# Patient Record
Sex: Male | Born: 1965
Health system: Southern US, Community
[De-identification: ages and names within clinical notes are randomized; demographics above are authoritative.]

## PROBLEM LIST (undated history)

## (undated) DIAGNOSIS — Z789 Other specified health status: Secondary | ICD-10-CM

## (undated) DIAGNOSIS — J309 Allergic rhinitis, unspecified: Secondary | ICD-10-CM

## (undated) DIAGNOSIS — E785 Hyperlipidemia, unspecified: Secondary | ICD-10-CM

## (undated) DIAGNOSIS — I10 Essential (primary) hypertension: Secondary | ICD-10-CM

---
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
## 1995-08-13 HISTORY — PX: OTHER SURGICAL HISTORY: SHX169

## 2003-02-24 ENCOUNTER — Observation Stay (HOSPITAL_COMMUNITY): Admission: EM | Admit: 2003-02-24 | Discharge: 2003-02-25 | Payer: Self-pay | Admitting: Emergency Medicine

## 2003-02-24 ENCOUNTER — Encounter: Payer: Self-pay | Admitting: Emergency Medicine

## 2003-03-08 ENCOUNTER — Ambulatory Visit (HOSPITAL_COMMUNITY): Admission: RE | Admit: 2003-03-08 | Discharge: 2003-03-08 | Payer: Self-pay | Admitting: Internal Medicine

## 2003-06-21 ENCOUNTER — Ambulatory Visit (HOSPITAL_COMMUNITY): Admission: RE | Admit: 2003-06-21 | Discharge: 2003-06-21 | Payer: Self-pay | Admitting: Internal Medicine

## 2003-07-04 ENCOUNTER — Emergency Department (HOSPITAL_COMMUNITY): Admission: EM | Admit: 2003-07-04 | Discharge: 2003-07-04 | Payer: Self-pay | Admitting: Emergency Medicine

## 2003-07-15 ENCOUNTER — Ambulatory Visit (HOSPITAL_COMMUNITY): Admission: RE | Admit: 2003-07-15 | Discharge: 2003-07-15 | Payer: Self-pay | Admitting: General Surgery

## 2005-07-31 ENCOUNTER — Ambulatory Visit (HOSPITAL_COMMUNITY): Admission: RE | Admit: 2005-07-31 | Discharge: 2005-07-31 | Payer: Self-pay | Admitting: Family Medicine

## 2006-08-07 ENCOUNTER — Other Ambulatory Visit: Admission: RE | Admit: 2006-08-07 | Discharge: 2006-08-07 | Payer: Self-pay | Admitting: Urology

## 2006-08-12 HISTORY — PX: SURGERY OF LIP: SUR1315

## 2006-08-12 HISTORY — PX: TOOTH EXTRACTION: SUR596

## 2010-11-07 ENCOUNTER — Ambulatory Visit (HOSPITAL_COMMUNITY)
Admission: RE | Admit: 2010-11-07 | Discharge: 2010-11-07 | Disposition: A | Discharge status: Home / Self Care | Payer: Self-pay | Source: Ambulatory Visit | Attending: Family Medicine | Admitting: Family Medicine

## 2010-11-07 ENCOUNTER — Other Ambulatory Visit (HOSPITAL_COMMUNITY): Payer: Self-pay | Admitting: Family Medicine

## 2010-11-07 DIAGNOSIS — R519 Headache, unspecified: Secondary | ICD-10-CM

## 2010-11-07 DIAGNOSIS — R55 Syncope and collapse: Secondary | ICD-10-CM

## 2010-11-07 DIAGNOSIS — R42 Dizziness and giddiness: Secondary | ICD-10-CM | POA: Insufficient documentation

## 2010-11-07 DIAGNOSIS — R51 Headache: Secondary | ICD-10-CM | POA: Insufficient documentation

## 2017-02-11 ENCOUNTER — Encounter: Payer: Self-pay | Admitting: *Deleted

## 2017-02-11 ENCOUNTER — Ambulatory Visit
Admission: EM | Admit: 2017-02-11 | Discharge: 2017-02-11 | Disposition: A | Discharge status: Home / Self Care | Payer: Worker's Compensation | Attending: Family Medicine | Admitting: Family Medicine

## 2017-02-11 ENCOUNTER — Ambulatory Visit (INDEPENDENT_AMBULATORY_CARE_PROVIDER_SITE_OTHER): Payer: Worker's Compensation

## 2017-02-11 DIAGNOSIS — S80811A Abrasion, right lower leg, initial encounter: Secondary | ICD-10-CM | POA: Diagnosis not present

## 2017-02-11 DIAGNOSIS — Z23 Encounter for immunization: Secondary | ICD-10-CM | POA: Diagnosis not present

## 2017-02-11 DIAGNOSIS — S86912A Strain of unspecified muscle(s) and tendon(s) at lower leg level, left leg, initial encounter: Secondary | ICD-10-CM

## 2017-02-11 DIAGNOSIS — S8002XA Contusion of left knee, initial encounter: Secondary | ICD-10-CM

## 2017-02-11 MED ORDER — MELOXICAM 15 MG PO TABS: 15.0000 mg | ORAL_TABLET | Freq: Every day | ORAL | 0 refills | Status: DC

## 2017-02-11 MED ORDER — TETANUS-DIPHTH-ACELL PERTUSSIS 5-2.5-18.5 LF-MCG/0.5 IM SUSP
0.5000 mL | Freq: Once | INTRAMUSCULAR | Status: AC
  Administered 2017-02-11: 0.5 mL via INTRAMUSCULAR

## 2017-02-11 MED ORDER — HYDROCODONE-ACETAMINOPHEN 5-325 MG PO TABS: 1.0000 | ORAL_TABLET | Freq: Three times a day (TID) | ORAL | 0 refills | Status: DC | PRN

## 2017-02-11 MED ORDER — MUPIROCIN 2 % EX OINT: 1.0000 "application " | TOPICAL_OINTMENT | Freq: Two times a day (BID) | CUTANEOUS | 0 refills | Status: DC

## 2017-02-11 MED ORDER — KETOROLAC TROMETHAMINE 60 MG/2ML IM SOLN
60.0000 mg | Freq: Once | INTRAMUSCULAR | Status: AC
  Administered 2017-02-11: 60 mg via INTRAMUSCULAR

## 2017-02-11 NOTE — ED Triage Notes (Signed)
Patient was stepping off his truck when he twisted his left knee and abrasion to his right leg last PM.

## 2017-02-11 NOTE — ED Provider Notes (Addendum)
[facility]    CSN: [id number] Arrival date & time: 02/11/17  0805     History   Chief Complaint Chief Complaint  Patient presents with  . Knee Injury  . Work Related Injury    HPI Timothy Lang is a 51 y.o. male.   Patient is a 51 year old white male who was stepping off a truck at work when his left leg basically hit the bottom step right leg also scraped the bottom step and his left knee as it was going to the ground he tried to stop from falling and torqued the left knee. This truck was not his usual truck but a replacement trunk and the steps were different he states from his regular truck. He's had no previous history of left knee problems before. States that the knee started swelling last night. He's placed frozen peas on his knees twice during the night with increased pain and tenderness and every time he moves his leg or his knee he hurts. He states his knee hurts when he stands when he tries to walk and is not that painful when he is sitting still. He has some difficulty with his gait as well. He does smoke no family medical history pertinent to today's visit no previous surgeries he denies any pertinent medical problems no known drug allergies.   The history is provided by the patient. No language interpreter was used.  Knee Pain  Location:  Knee Time since incident:  1 day Injury: yes   Mechanism of injury: fall   Fall:    Fall occurred:  Down stairs   Impact surface:  Hard floor   Point of impact:  Knees   Entrapped after fall: no   Knee location:  L knee Pain details:    Quality:  Throbbing and sharp   Radiates to:  Does not radiate   Severity:  Severe   Onset quality:  Sudden   Progression:  Worsening Chronicity:  New Tetanus status:  Unknown Prior injury to area:  No Relieved by:  Nothing Worsened by:  Bearing weight and activity   History reviewed. No pertinent past medical history.  There are no active problems to display for this  patient.   History reviewed. No pertinent surgical history.     Home Medications    Prior to Admission medications   Medication Sig Start Date End Date Taking? Authorizing Provider  HYDROcodone-acetaminophen (NORCO) 5-325 MG tablet Take 1 tablet by mouth every 8 (eight) hours as needed for moderate pain. 02/11/17   Hassan Rowan, MD  meloxicam (MOBIC) 15 MG tablet Take 1 tablet (15 mg total) by mouth daily. 02/11/17   Hassan Rowan, MD  mupirocin ointment (BACTROBAN) 2 % Apply 1 application topically 2 (two) times daily. 02/11/17   Hassan Rowan, MD    Family History History reviewed. No pertinent family history.  Social History Social History  Substance Use Topics  . Smoking status: Current Every Day Smoker    Packs/day: 0.50    Types: Cigarettes  . Smokeless tobacco: Never Used  . Alcohol use No     Allergies   Patient has no known allergies.   Review of Systems Review of Systems  Musculoskeletal: Positive for gait problem, joint swelling and myalgias.  Skin: Positive for rash.  All other systems reviewed and are negative.    Physical Exam Triage Vital Signs ED Triage Vitals  Enc Vitals Group     BP 02/11/17 0832 (!) 139/95  Pulse Rate 02/11/17 0832 67     Resp 02/11/17 0832 16     Temp 02/11/17 0832 98.1 F (36.7 C)     Temp Source 02/11/17 0832 Oral     SpO2 02/11/17 0832 99 %     Weight 02/11/17 0832 190 lb (86.2 kg)     Height 02/11/17 0832 6' (1.829 m)     Head Circumference --      Peak Flow --      Pain Score 02/11/17 0833 6     Pain Loc --      Pain Edu? --      Excl. in GC? --    No data found.   Updated Vital Signs BP (!) 139/95 (BP Location: Left Arm)   Pulse 67   Temp 98.1 F (36.7 C) (Oral)   Resp 16   Ht 6' (1.829 m)   Wt 190 lb (86.2 kg)   SpO2 99%   BMI 25.77 kg/m   Visual Acuity Right Eye Distance:   Left Eye Distance:   Bilateral Distance:    Right Eye Near:   Left Eye Near:    Bilateral Near:     Physical Exam    Constitutional: He is oriented to person, place, and time. He appears well-developed and well-nourished.  HENT:  Head: Normocephalic and atraumatic.  Eyes: Conjunctivae are normal. Pupils are equal, round, and reactive to light.  Neck: Normal range of motion. Neck supple.  Pulmonary/Chest: Effort normal.  Musculoskeletal: He exhibits tenderness.       Left knee: He exhibits decreased range of motion, swelling, effusion and bony tenderness. Tenderness found.       Right lower leg: He exhibits laceration.       Legs: Abrasions present over the right lower leg over the left knee left knee is markedly swollen there is fluid present on the knee he has marked limited range of motion with pain on trying to do the anterior drawer test difficulty to assess it comes amount of swelling and discomfort he is having with the left knee  Neurological: He is alert and oriented to person, place, and time. No cranial nerve deficit.  Skin: Rash noted. There is erythema.  Psychiatric: He has a normal mood and affect.  Vitals reviewed.    UC Treatments / Results  Labs (all labs ordered are listed, but only abnormal results are displayed) Labs Reviewed - No data to display  EKG  EKG Interpretation None       Radiology Dg Knee Complete 4 Views Left  Result Date: 02/11/2017 CLINICAL DATA:  Jumping injury with left knee pain, initial encounter EXAM: LEFT KNEE - COMPLETE 4+ VIEW COMPARISON:  None. FINDINGS: No acute fracture or dislocation is noted. Very mild joint space narrowing is noted medially as well as some patellofemoral spurring. IMPRESSION: Mild degenerative change without acute abnormality. Electronically Signed   By: Alcide CleverMark  Lukens M.D.   On: 02/11/2017 09:28    Procedures Procedures (including critical care time)  Medications Ordered in UC Medications  ketorolac (TORADOL) injection 60 mg (60 mg Intramuscular Given 02/11/17 0906)  Tdap (BOOSTRIX) injection 0.5 mL (0.5 mLs Intramuscular Given  02/11/17 0904)     Initial Impression / Assessment and Plan / UC Course  I have reviewed the triage vital signs and the nursing notes.  Pertinent labs & imaging results that were available during my care of the patient were reviewed by me and considered in my medical decision making (see chart for  details).    Braces on the right lower leg with treatment with tetanus shot and Bactroban ointment twice a day because the mild swelling and tenderness over his left knee were permanent knee immobilizer crutches giving him 5 days worth of Vicodin worked up at the [employer] reporting site nothing as far as narcotics this year he has used a [facility] in the past always is on the site. We'll have him take 1 tablet every 8 hours on when necessary basis mainly at night to rest. 60 Toradol IM will be given as well. We'll have him follow-up with Ms. Hassell Halim next week   Final Clinical Impressions(s) / UC Diagnoses   Final diagnoses:  Contusion of left knee, initial encounter  Strain of left knee, initial encounter  Abrasion of right lower extremity, initial encounter    New Prescriptions Discharge Medication List as of 02/11/2017  9:53 AM    START taking these medications   Details  HYDROcodone-acetaminophen (NORCO) 5-325 MG tablet Take 1 tablet by mouth every 8 (eight) hours as needed for moderate pain., Starting Tue 02/11/2017, Normal    meloxicam (MOBIC) 15 MG tablet Take 1 tablet (15 mg total) by mouth daily., Starting Tue 02/11/2017, Normal    mupirocin ointment (BACTROBAN) 2 % Apply 1 application topically 2 (two) times daily., Starting Tue 02/11/2017, Normal         Note: This dictation was prepared with Dragon dictation along with smaller phrase technology. Any transcriptional errors that result from this process are unintentional.   Hassan Rowan, MD 02/11/17 1057    Hassan Rowan, MD 02/11/17 1101

## 2017-04-03 DIAGNOSIS — S83249A Other tear of medial meniscus, current injury, unspecified knee, initial encounter: Secondary | ICD-10-CM | POA: Insufficient documentation

## 2017-04-10 ENCOUNTER — Ambulatory Visit: Payer: Self-pay | Admitting: Orthopedic Surgery

## 2017-04-15 ENCOUNTER — Inpatient Hospital Stay: Admission: RE | Admit: 2017-04-15 | Payer: Self-pay | Source: Ambulatory Visit

## 2017-04-15 ENCOUNTER — Ambulatory Visit
Admission: RE | Admit: 2017-04-15 | Discharge: 2017-04-15 | Disposition: A | Discharge status: Home / Self Care | Payer: Self-pay | Source: Ambulatory Visit | Attending: Orthopedic Surgery | Admitting: Orthopedic Surgery

## 2017-04-15 ENCOUNTER — Ambulatory Visit: Payer: Self-pay | Admitting: Orthopedic Surgery

## 2017-04-15 DIAGNOSIS — X58XXXA Exposure to other specified factors, initial encounter: Secondary | ICD-10-CM | POA: Insufficient documentation

## 2017-04-15 DIAGNOSIS — Z01818 Encounter for other preprocedural examination: Secondary | ICD-10-CM | POA: Insufficient documentation

## 2017-04-15 DIAGNOSIS — S83222A Peripheral tear of medial meniscus, current injury, left knee, initial encounter: Secondary | ICD-10-CM | POA: Insufficient documentation

## 2017-04-15 HISTORY — DX: Other specified health status: Z78.9

## 2017-04-15 NOTE — Patient Instructions (Signed)
  Your procedure is scheduled ZO:XWRUEAVWon:tomorrow Sept 5 , 2018. Report to Same Day Surgery at 2:30 pm.  Remember: Instructions that are not followed completely may result in serious medical risk, up to and including death, or upon the discretion of your surgeon and anesthesiologist your surgery may need to be rescheduled.    _x___ 1. Do not eat food or drink liquids after midnight. No gum chewing or hard candies. Clear liquids until   12:30pm.    ____ 2. No Alcohol for 24 hours before or after surgery.   ____ 3. Bring all medications with you on the day of surgery if instructed.    __x__ 4. Notify your doctor if there is any change in your medical condition     (cold, fever, infections).    _____ 5. No smoking 24 hours prior to surgery.     Do not wear jewelry, make-up, hairpins, clips or nail polish.  Do not wear lotions, powders, or perfumes.   Do not shave 48 hours prior to surgery. Men may shave face and neck.  Do not bring valuables to the hospital.    [facility] is not responsible for any belongings or valuables.               Contacts, dentures or bridgework may not be worn into surgery.  Leave your suitcase in the car. After surgery it may be brought to your room.  For patients admitted to the hospital, discharge time is determined by your treatment team.   Patients discharged the day of surgery will not be allowed to drive home.    Please read over the following fact sheets that you were given:   [facility] Pre[facility]  ____ Take these medicines the morning of surgery with A SIP OF WATER:       ____ Fleet Enema (as directed)   __x__ Use CHG Soap as directed on instruction sheet  ____ Use inhalers on the day of surgery and bring to hospital day of surgery  ____ Stop metformin 2 days prior to surgery    ____ Take 1/2 of usual insulin dose the night before surgery and none on the morning of  surgery.   ____ Stop Coumadin/Plavix/aspirin on does not  apply.  _x___ Stop Anti-inflammatories such as Advil, Aleve, Ibuprofen, Motrin, Naproxen,  Naprosyn, Goodies powders or aspirin  products. OK to take Tylenol.   ____ Stop supplements until after surgery.    ____ Bring C-Pap to the hospital.

## 2017-04-15 NOTE — Pre-Procedure Instructions (Signed)
Pt reports he has crutches and knows how to use them.

## 2017-04-16 ENCOUNTER — Encounter: Payer: Self-pay | Admitting: *Deleted

## 2017-04-16 ENCOUNTER — Ambulatory Visit: Payer: Worker's Compensation | Admitting: Anesthesiology

## 2017-04-16 ENCOUNTER — Ambulatory Visit
Admission: RE | Admit: 2017-04-16 | Discharge: 2017-04-16 | Disposition: A | Discharge status: Home / Self Care | Payer: Worker's Compensation | Source: Ambulatory Visit | Attending: Orthopedic Surgery | Admitting: Orthopedic Surgery

## 2017-04-16 ENCOUNTER — Encounter
Admission: RE | Disposition: A | Discharge status: Home / Self Care | Payer: Self-pay | Source: Ambulatory Visit | Attending: Orthopedic Surgery

## 2017-04-16 DIAGNOSIS — F17210 Nicotine dependence, cigarettes, uncomplicated: Secondary | ICD-10-CM | POA: Insufficient documentation

## 2017-04-16 DIAGNOSIS — Y9289 Other specified places as the place of occurrence of the external cause: Secondary | ICD-10-CM | POA: Insufficient documentation

## 2017-04-16 DIAGNOSIS — M659 Synovitis and tenosynovitis, unspecified: Secondary | ICD-10-CM | POA: Diagnosis not present

## 2017-04-16 DIAGNOSIS — S83242A Other tear of medial meniscus, current injury, left knee, initial encounter: Secondary | ICD-10-CM | POA: Diagnosis present

## 2017-04-16 DIAGNOSIS — X509XXA Other and unspecified overexertion or strenuous movements or postures, initial encounter: Secondary | ICD-10-CM | POA: Insufficient documentation

## 2017-04-16 DIAGNOSIS — Y9389 Activity, other specified: Secondary | ICD-10-CM | POA: Insufficient documentation

## 2017-04-16 DIAGNOSIS — M94262 Chondromalacia, left knee: Secondary | ICD-10-CM | POA: Diagnosis not present

## 2017-04-16 DIAGNOSIS — Y999 Unspecified external cause status: Secondary | ICD-10-CM | POA: Insufficient documentation

## 2017-04-16 DIAGNOSIS — S83212A Bucket-handle tear of medial meniscus, current injury, left knee, initial encounter: Secondary | ICD-10-CM | POA: Diagnosis not present

## 2017-04-16 DIAGNOSIS — X501XXA Overexertion from prolonged static or awkward postures, initial encounter: Secondary | ICD-10-CM | POA: Insufficient documentation

## 2017-04-16 HISTORY — PX: KNEE ARTHROSCOPY WITH MEDIAL MENISECTOMY: SHX5651

## 2017-04-16 SURGERY — ARTHROSCOPY, KNEE, WITH MEDIAL MENISCECTOMY
Anesthesia: General | Site: Knee | Laterality: Left | Wound class: Clean

## 2017-04-16 MED ORDER — HYDROCODONE-ACETAMINOPHEN 5-325 MG PO TABS: 1.0000 | ORAL_TABLET | ORAL | 0 refills | Status: AC | PRN

## 2017-04-16 MED ORDER — ONDANSETRON HCL 4 MG/2ML IJ SOLN
INTRAMUSCULAR | Status: DC | PRN
  Administered 2017-04-16: 4 mg via INTRAVENOUS

## 2017-04-16 MED ORDER — ACETAMINOPHEN 500 MG PO TABS
1000.0000 mg | ORAL_TABLET | Freq: Once | ORAL | Status: AC
  Administered 2017-04-16: 1000 mg via ORAL

## 2017-04-16 MED ORDER — MORPHINE SULFATE (PF) 4 MG/ML IV SOLN
1.0000 mg | INTRAVENOUS | Status: DC | PRN
Start: 2017-04-16 — End: 2017-04-16

## 2017-04-16 MED ORDER — BUPIVACAINE-EPINEPHRINE 0.25% -1:200000 IJ SOLN
INTRAMUSCULAR | Status: DC | PRN
  Administered 2017-04-16: 4 mL

## 2017-04-16 MED ORDER — HYDROCODONE-ACETAMINOPHEN 5-325 MG PO TABS: 1.0000 | ORAL_TABLET | ORAL | Status: DC | PRN

## 2017-04-16 MED ORDER — METHYLPREDNISOLONE ACETATE 40 MG/ML IJ SUSP
INTRAMUSCULAR | Status: DC | PRN
  Administered 2017-04-16: 40 mg

## 2017-04-16 MED ORDER — BUPIVACAINE-EPINEPHRINE (PF) 0.25% -1:200000 IJ SOLN
INTRAMUSCULAR | Status: AC
  Filled 2017-04-16: qty 30

## 2017-04-16 MED ORDER — LACTATED RINGERS IV SOLN: INTRAVENOUS | Status: DC

## 2017-04-16 MED ORDER — MORPHINE SULFATE (PF) 4 MG/ML IV SOLN
INTRAVENOUS | Status: DC | PRN
  Administered 2017-04-16: 4 mg

## 2017-04-16 MED ORDER — ONDANSETRON HCL 4 MG/2ML IJ SOLN
INTRAMUSCULAR | Status: AC
  Filled 2017-04-16: qty 2

## 2017-04-16 MED ORDER — FENTANYL CITRATE (PF) 100 MCG/2ML IJ SOLN
INTRAMUSCULAR | Status: AC
  Filled 2017-04-16: qty 2

## 2017-04-16 MED ORDER — DEXAMETHASONE SODIUM PHOSPHATE 10 MG/ML IJ SOLN
INTRAMUSCULAR | Status: AC
  Filled 2017-04-16: qty 1

## 2017-04-16 MED ORDER — GABAPENTIN 300 MG PO CAPS
300.0000 mg | ORAL_CAPSULE | Freq: Once | ORAL | Status: AC
  Administered 2017-04-16: 300 mg via ORAL

## 2017-04-16 MED ORDER — CEFAZOLIN SODIUM-DEXTROSE 2-4 GM/100ML-% IV SOLN
INTRAVENOUS | Status: AC
  Filled 2017-04-16: qty 100

## 2017-04-16 MED ORDER — ACETAMINOPHEN 500 MG PO TABS
ORAL_TABLET | ORAL | Status: AC
  Filled 2017-04-16: qty 2

## 2017-04-16 MED ORDER — PROPOFOL 10 MG/ML IV BOLUS
INTRAVENOUS | Status: DC | PRN
  Administered 2017-04-16: 180 mg via INTRAVENOUS

## 2017-04-16 MED ORDER — OXYCODONE-ACETAMINOPHEN 5-325 MG PO TABS: 1.0000 | ORAL_TABLET | ORAL | Status: DC | PRN

## 2017-04-16 MED ORDER — METHYLPREDNISOLONE ACETATE 40 MG/ML IJ SUSP
INTRAMUSCULAR | Status: AC
  Filled 2017-04-16: qty 1

## 2017-04-16 MED ORDER — CHLORHEXIDINE GLUCONATE 4 % EX LIQD
60.0000 mL | Freq: Once | CUTANEOUS | Status: AC
  Administered 2017-04-16: 4 via TOPICAL

## 2017-04-16 MED ORDER — METOCLOPRAMIDE HCL 10 MG PO TABS: 5.0000 mg | ORAL_TABLET | Freq: Three times a day (TID) | ORAL | Status: DC | PRN

## 2017-04-16 MED ORDER — METOCLOPRAMIDE HCL 5 MG/ML IJ SOLN: 5.0000 mg | Freq: Three times a day (TID) | INTRAMUSCULAR | Status: DC | PRN

## 2017-04-16 MED ORDER — ONDANSETRON HCL 4 MG/2ML IJ SOLN: 4.0000 mg | Freq: Four times a day (QID) | INTRAMUSCULAR | Status: DC | PRN

## 2017-04-16 MED ORDER — ROPIVACAINE HCL 5 MG/ML IJ SOLN
INTRAMUSCULAR | Status: DC | PRN
  Administered 2017-04-16: 20 mL

## 2017-04-16 MED ORDER — LIDOCAINE HCL (PF) 2 % IJ SOLN
INTRAMUSCULAR | Status: AC
  Filled 2017-04-16: qty 2

## 2017-04-16 MED ORDER — EPINEPHRINE 30 MG/30ML IJ SOLN
INTRAMUSCULAR | Status: AC
  Filled 2017-04-16: qty 1

## 2017-04-16 MED ORDER — FENTANYL CITRATE (PF) 100 MCG/2ML IJ SOLN: 25.0000 ug | INTRAMUSCULAR | Status: DC | PRN

## 2017-04-16 MED ORDER — ONDANSETRON HCL 4 MG/2ML IJ SOLN: 4.0000 mg | Freq: Once | INTRAMUSCULAR | Status: DC | PRN

## 2017-04-16 MED ORDER — ONDANSETRON HCL 4 MG PO TABS: 4.0000 mg | ORAL_TABLET | Freq: Four times a day (QID) | ORAL | Status: DC | PRN

## 2017-04-16 MED ORDER — EPHEDRINE SULFATE 50 MG/ML IJ SOLN
INTRAMUSCULAR | Status: AC
  Filled 2017-04-16: qty 1

## 2017-04-16 MED ORDER — PROPOFOL 10 MG/ML IV BOLUS
INTRAVENOUS | Status: AC
  Filled 2017-04-16: qty 20

## 2017-04-16 MED ORDER — KETOROLAC TROMETHAMINE 30 MG/ML IJ SOLN
INTRAMUSCULAR | Status: DC | PRN
  Administered 2017-04-16: 30 mg via INTRAVENOUS

## 2017-04-16 MED ORDER — CEFAZOLIN SODIUM-DEXTROSE 2-4 GM/100ML-% IV SOLN
2.0000 g | INTRAVENOUS | Status: AC
  Administered 2017-04-16: 2 g via INTRAVENOUS

## 2017-04-16 MED ORDER — LIDOCAINE HCL (CARDIAC) 20 MG/ML IV SOLN
INTRAVENOUS | Status: DC | PRN
  Administered 2017-04-16: 60 mg via INTRAVENOUS

## 2017-04-16 MED ORDER — FAMOTIDINE 20 MG PO TABS
ORAL_TABLET | ORAL | Status: AC
  Filled 2017-04-16: qty 1

## 2017-04-16 MED ORDER — GABAPENTIN 300 MG PO CAPS
ORAL_CAPSULE | ORAL | Status: AC
  Filled 2017-04-16: qty 1

## 2017-04-16 MED ORDER — ROPIVACAINE HCL 5 MG/ML IJ SOLN
INTRAMUSCULAR | Status: AC
  Filled 2017-04-16: qty 20

## 2017-04-16 MED ORDER — DEXAMETHASONE SODIUM PHOSPHATE 10 MG/ML IJ SOLN
INTRAMUSCULAR | Status: DC | PRN
  Administered 2017-04-16: 5 mg via INTRAVENOUS

## 2017-04-16 MED ORDER — MIDAZOLAM HCL 2 MG/2ML IJ SOLN
INTRAMUSCULAR | Status: AC
  Filled 2017-04-16: qty 2

## 2017-04-16 MED ORDER — KETOROLAC TROMETHAMINE 30 MG/ML IJ SOLN
INTRAMUSCULAR | Status: AC
  Filled 2017-04-16: qty 1

## 2017-04-16 MED ORDER — EPHEDRINE SULFATE 50 MG/ML IJ SOLN
INTRAMUSCULAR | Status: DC | PRN
  Administered 2017-04-16: 10 mg via INTRAVENOUS

## 2017-04-16 MED ORDER — DOCUSATE SODIUM 100 MG PO CAPS: 100.0000 mg | ORAL_CAPSULE | Freq: Every day | ORAL | 2 refills | Status: AC | PRN

## 2017-04-16 MED ORDER — FENTANYL CITRATE (PF) 100 MCG/2ML IJ SOLN
INTRAMUSCULAR | Status: DC | PRN
  Administered 2017-04-16: 25 ug via INTRAVENOUS
  Administered 2017-04-16: 50 ug via INTRAVENOUS
  Administered 2017-04-16 (×2): 25 ug via INTRAVENOUS
  Administered 2017-04-16: 50 ug via INTRAVENOUS
  Administered 2017-04-16: 25 ug via INTRAVENOUS

## 2017-04-16 MED ORDER — MIDAZOLAM HCL 5 MG/5ML IJ SOLN
INTRAMUSCULAR | Status: DC | PRN
  Administered 2017-04-16: 2 mg via INTRAVENOUS

## 2017-04-16 MED ORDER — LACTATED RINGERS IV SOLN
INTRAVENOUS | Status: DC
  Administered 2017-04-16 (×2): via INTRAVENOUS

## 2017-04-16 MED ORDER — FAMOTIDINE 20 MG PO TABS
20.0000 mg | ORAL_TABLET | Freq: Once | ORAL | Status: AC
  Administered 2017-04-16: 20 mg via ORAL

## 2017-04-16 MED ORDER — MORPHINE SULFATE (PF) 4 MG/ML IV SOLN
INTRAVENOUS | Status: AC
  Filled 2017-04-16: qty 1

## 2017-04-16 SURGICAL SUPPLY — 30 items
ADAPTER IRRIG TUBE 2 SPIKE SOL (ADAPTER) ×4 IMPLANT
BLADE FULL RADIUS 3.5 (BLADE) ×2 IMPLANT
BLADE INCISOR PLUS 4.5 (BLADE) ×2 IMPLANT
BLADE SHAVER 4.5 DBL SERAT CV (CUTTER) ×2 IMPLANT
BLADE SURG SZ11 CARB STEEL (BLADE) ×2 IMPLANT
BRUSH SCRUB EZ  4% CHG (MISCELLANEOUS) ×2
BRUSH SCRUB EZ 4% CHG (MISCELLANEOUS) ×2 IMPLANT
CHLORAPREP W/TINT 26ML (MISCELLANEOUS) ×2 IMPLANT
COOLER POLAR GLACIER W/PUMP (MISCELLANEOUS) ×2 IMPLANT
DRAPE IMP U-DRAPE 54X76 (DRAPES) ×2 IMPLANT
GAUZE PETRO XEROFOAM 1X8 (MISCELLANEOUS) ×2 IMPLANT
GAUZE SPONGE 4X4 12PLY STRL (GAUZE/BANDAGES/DRESSINGS) ×2 IMPLANT
GLOVE INDICATOR 8.0 STRL GRN (GLOVE) ×2 IMPLANT
GLOVE SURG ORTHO 8.0 STRL STRW (GLOVE) ×2 IMPLANT
GOWN STRL REUS W/ TWL LRG LVL3 (GOWN DISPOSABLE) ×1 IMPLANT
GOWN STRL REUS W/ TWL XL LVL3 (GOWN DISPOSABLE) ×1 IMPLANT
GOWN STRL REUS W/TWL LRG LVL3 (GOWN DISPOSABLE) ×1
GOWN STRL REUS W/TWL XL LVL3 (GOWN DISPOSABLE) ×1
IV LACTATED RINGER IRRG 3000ML (IV SOLUTION) ×4
IV LR IRRIG 3000ML ARTHROMATIC (IV SOLUTION) ×4 IMPLANT
KIT RM TURNOVER STRD PROC AR (KITS) ×2 IMPLANT
MANIFOLD NEPTUNE II (INSTRUMENTS) ×2 IMPLANT
NEEDLE SPNL 20GX3.5 QUINCKE YW (NEEDLE) ×2 IMPLANT
PACK ARTHROSCOPY KNEE (MISCELLANEOUS) ×2 IMPLANT
PAD WRAPON POLAR KNEE (MISCELLANEOUS) ×1 IMPLANT
SUT ETHILON 4-0 (SUTURE) ×1
SUT ETHILON 4-0 FS2 18XMFL BLK (SUTURE) ×1
SUTURE ETHLN 4-0 FS2 18XMF BLK (SUTURE) ×1 IMPLANT
TUBING ARTHRO INFLOW-ONLY STRL (TUBING) ×2 IMPLANT
WRAPON POLAR PAD KNEE (MISCELLANEOUS) ×2

## 2017-04-16 NOTE — Discharge Instructions (Signed)
Post Op Home Instructions for Knee Arthroscopy  1) Do not sit for longer than 1 hour at a time with your leg dangling down.  You should have your legs elevated (higher than your heart) in a recliner chair or couch.  2) You may be up walking around as tolerated but should take periodic breaks to elevate your legs.  Discontinue use of crutches when you feel you are able to walk without pain or a limp.  3) Work on gentle bending and straightening of the knee.  4) You may remove the Ace wrap and dressings two days after surgery.  Place band aids over the incision sites.  5) You may shower after you remove the surgical dressing.  You do not need to cover the incision with plastic wrap.  The incision can get wet, but do not submerge under water.  After your sutures have been removed, you should wait 24 hours before submerging incision under water.  6) Pain medication can cause constipation.  You should increase your fluid intake, increase your intake of high fiber foods and/or take Metamucil as needed for constipation.  7) Continue your physical therapy exercises, as shown at the office, at least twice daily.  You should set up outpatient physical therapy and start within the first week after surgery.  Physical therapy 9/12.  8) Continue to use your Polar Pack continuously for 2-3 days after surgery.  After you remove the surgical dressing, it is a good idea to use your Polar Pack or ice pack for 30 minutes after doing your exercises to reduce swelling.  9) Do not be surprised if you have increased pain at night.  This usually means you have been a little too active during the day and need to reduce your activities.  10) If you develop lower extremity swelling that does not improve after a night of elevation, please call the office.  This could be an early sign of a blood clot.  Please call with any questions at [phone]  AMBULATORY SURGERY  DISCHARGE INSTRUCTIONS   1) The drugs that you  were given will stay in your system until tomorrow so for the next 24 hours you should not:  A) Drive an automobile B) Make any legal decisions C) Drink any alcoholic beverage   2) You may resume regular meals tomorrow.  Today it is better to start with liquids and gradually work up to solid foods.  You may eat anything you prefer, but it is better to start with liquids, then soup and crackers, and gradually work up to solid foods.   3) Please notify your doctor immediately if you have any unusual bleeding, trouble breathing, redness and pain at the surgery site, drainage, fever, or pain not relieved by medication.    4) Additional Instructions:        Please contact your physician with any problems or Same Day Surgery at [phone], Monday through Friday 6 am to 4 pm, or [place] at [facility] Main number at [phone].

## 2017-04-16 NOTE — Anesthesia Procedure Notes (Signed)
Procedure Name: LMA Insertion Date/Time: 04/16/2017 3:09 PM Performed by: Lily KocherPERALTA, Timothy Cech Pre-anesthesia Checklist: Patient identified, Patient being monitored, Timeout performed, Emergency Drugs available and Suction available Patient Re-evaluated:Patient Re-evaluated prior to induction Oxygen Delivery Method: Circle system utilized Preoxygenation: Pre-oxygenation with 100% oxygen Induction Type: IV induction Ventilation: Mask ventilation without difficulty LMA: LMA inserted LMA Size: 5.0 Tube type: Oral Number of attempts: 1 Placement Confirmation: positive ETCO2 and breath sounds checked- equal and bilateral Tube secured with: Tape Dental Injury: Teeth and Oropharynx as per pre-operative assessment

## 2017-04-16 NOTE — Anesthesia Postprocedure Evaluation (Signed)
Anesthesia Post Note  Patient: Hortense RamalRobert W Califano  Procedure(s) Performed: Procedure(s) (LRB): KNEE ARTHROSCOPY WITH PARTIAL MEDIAL MENISECTOMY, CHONDROPLASTY, PARTIAL SYNOVECTOMY (Left)  Patient location during evaluation: PACU Anesthesia Type: General Level of consciousness: awake and alert Pain management: pain level controlled Vital Signs Assessment: post-procedure vital signs reviewed and stable Respiratory status: spontaneous breathing, nonlabored ventilation, respiratory function stable and patient connected to nasal cannula oxygen Cardiovascular status: blood pressure returned to baseline and stable Postop Assessment: no signs of nausea or vomiting Anesthetic complications: no     Last Vitals:  Vitals:   04/16/17 1753 04/16/17 1758  BP: (!) 156/95 (!) 144/97  Pulse:  72  Resp: 16   Temp:    SpO2: 99% 99%    Last Pain:  Vitals:   04/16/17 1736  TempSrc:   PainSc: 0-No pain                 Lenard SimmerAndrew Selyna Klahn

## 2017-04-16 NOTE — Anesthesia Post-op Follow-up Note (Signed)
Anesthesia QCDR form completed.        

## 2017-04-16 NOTE — H&P (Signed)
The patient has been re-examined, and the chart reviewed, and there have been no interval changes to the documented history and physical.  Plan a left knee arthroscopy today.  Anesthesia is not consulted regarding a peripheral nerve block for post-operative pain.  The risks, benefits, and alternatives have been discussed at length, and the patient is willing to proceed.    

## 2017-04-16 NOTE — Op Note (Signed)
  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 male with a diagnosis of [place] who failed conservative management and elected for surgical management.    The risks benefits and alternatives were discussed with the patient preoperatively including the risks of infection, bleeding, nerve injury, knee stiffness, persistent pain, osteoarthritis and the need for further surgery. Medical  risks include DVT and pulmonary embolism, myocardial infarction, stroke, pneumonia, respiratory failure and death. The patient understood these risks and wished to proceed.   OPERATIVE FINDINGS: Bucket handle, displaced tear of the medial meniscus, Grade 1 chondromalacia of the medial compartment, synovitic synovitis of all 3 compartments. ACL and PCL were intact. The patella and landing zone were intact. No loose bodies were identified within the medial or lateral gutters.  OPERATIVE PROCEDURE: Patient was met in the preoperative area. The operative extremity was signed with my initials according the hospital's correct site of surgery protocol.  The patient was brought to the operating room where they was placed supine on the operative table. General anesthesia was administered. The patient was prepped and draped in a sterile fashion.  A timeout was performed to verify the patient's name, date of birth, medical record number, correct site of surgery correct procedure to be performed. It was also used to verify the patient received antibiotics that all appropriate  instruments, and radiographic studies were available in the room. Once all in attendance were in agreement, the case began.  Proposed arthroscopy incisions were drawn out with a surgical marker. These were pre-injected with 0.5% marcaine with epinephrine. An 11 blade was used to establish an inferior lateral and inferomedial portals. The inferomedial portal was created using a 18-gauge spinal needle under direct visualization.  A full diagnostic examination of the knee was performed including the suprapatellar pouch, patellofemoral joint, medial/lateral compartments as well as the medial/lateral gutters, the intercondylar notch and the posterior knee.  Patient had the meniscal tear treated with a 4-0 resector and incisor plus shaver blade and straight duckbill basket. The meniscus was debrided until a stable rim was achieved. A chondroplasty of the medial femoral condyle was also performed using a 4-0 resector shaver blade. A partial synovectomy was also performed using a 4-0 resector shaver blade.  The knee was then copiously lavaged. All arthroscopic instruments were removed. The 2 arthroscopy portals were closed with 4-0 nylon. A dry sterile and compressive dressing was applied. The patient was brought to the PACU in stable condition. I was scrubbed and present for the entire case and all sharp and instrument counts were correct at the conclusion the case. I spoke with the patient's family postoperatively to let them know the case was performed without complication and the patient was stable in the recovery room.  Kurtis Bushman, MD

## 2017-04-16 NOTE — Transfer of Care (Signed)
Immediate Anesthesia Transfer of Care Note  Patient: Timothy Lang  Procedure(s) Performed: Procedure(s): KNEE ARTHROSCOPY WITH PARTIAL MEDIAL MENISECTOMY, CHONDROPLASTY, PARTIAL SYNOVECTOMY (Left)  Patient Location: PACU  Anesthesia Type:General  Level of Consciousness: sedated  Airway & Oxygen Therapy: Patient Spontanous Breathing and Patient connected to face mask oxygen  Post-op Assessment: Report given to RN and Post -op Vital signs reviewed and stable  Post vital signs: Reviewed and stable  Last Vitals:  Vitals:   04/16/17 1432 04/16/17 1637  BP: (!) 170/98 119/79  Pulse: 78 63  Resp: 16 13  Temp: (!) 36.4 C (!) 36.1 C  SpO2: 97% 98%    Last Pain:  Vitals:   04/16/17 1432  TempSrc: Tympanic  PainSc: 6          Complications: No apparent anesthesia complications

## 2017-04-16 NOTE — Anesthesia Preprocedure Evaluation (Signed)
Anesthesia Evaluation  Patient identified by MRN, date of birth, ID band Patient awake    Reviewed: Allergy & Precautions, NPO status , Patient's Chart, lab work & pertinent test results  History of Anesthesia Complications Negative for: history of anesthetic complications  Airway Mallampati: I       Dental   Pulmonary neg sleep apnea, neg COPD, former smoker,           Cardiovascular (-) hypertension(-) Past MI and (-) CHF (-) dysrhythmias (-) Valvular Problems/Murmurs     Neuro/Psych neg Seizures    GI/Hepatic Neg liver ROS, neg GERD  ,  Endo/Other  neg diabetes  Renal/GU negative Renal ROS     Musculoskeletal   Abdominal   Peds  Hematology   Anesthesia Other Findings   Reproductive/Obstetrics                             Anesthesia Physical Anesthesia Plan  ASA: II  Anesthesia Plan: General   Post-op Pain Management:    Induction:   PONV Risk Score and Plan:   Airway Management Planned: LMA  Additional Equipment:   Intra-op Plan:   Post-operative Plan:   Informed Consent: I have reviewed the patients History and Physical, chart, labs and discussed the procedure including the risks, benefits and alternatives for the proposed anesthesia with the patient or authorized representative who has indicated his/her understanding and acceptance.     Plan Discussed with:   Anesthesia Plan Comments:         Anesthesia Quick Evaluation

## 2017-04-17 ENCOUNTER — Encounter: Payer: Self-pay | Admitting: Orthopedic Surgery

## 2018-09-25 ENCOUNTER — Encounter: Payer: Self-pay | Admitting: Family Medicine

## 2018-09-25 ENCOUNTER — Ambulatory Visit (INDEPENDENT_AMBULATORY_CARE_PROVIDER_SITE_OTHER): Payer: BLUE CROSS/BLUE SHIELD | Admitting: Family Medicine

## 2018-09-25 VITALS — BP 134/88 | HR 75 | Temp 98.5°F | Resp 16 | Ht 72.0 in | Wt 210.6 lb

## 2018-09-25 DIAGNOSIS — Z1211 Encounter for screening for malignant neoplasm of colon: Secondary | ICD-10-CM

## 2018-09-25 DIAGNOSIS — Z7689 Persons encountering health services in other specified circumstances: Secondary | ICD-10-CM | POA: Diagnosis not present

## 2018-09-25 DIAGNOSIS — R030 Elevated blood-pressure reading, without diagnosis of hypertension: Secondary | ICD-10-CM

## 2018-09-25 DIAGNOSIS — I10 Essential (primary) hypertension: Secondary | ICD-10-CM | POA: Insufficient documentation

## 2018-09-25 DIAGNOSIS — H8113 Benign paroxysmal vertigo, bilateral: Secondary | ICD-10-CM | POA: Insufficient documentation

## 2018-09-25 DIAGNOSIS — J3089 Other allergic rhinitis: Secondary | ICD-10-CM

## 2018-09-25 MED ORDER — MECLIZINE HCL 25 MG PO TABS: 25.0000 mg | ORAL_TABLET | Freq: Three times a day (TID) | ORAL | 1 refills | Status: DC | PRN

## 2018-09-25 MED ORDER — FLUTICASONE PROPIONATE 50 MCG/ACT NA SUSP: 2.0000 | Freq: Every day | NASAL | 3 refills | Status: DC

## 2018-09-25 NOTE — Patient Instructions (Addendum)
Thank you for coming to the office today.  Check BP regularly if you can few times a week, write down readings and bring to next visit  Limit sodium in diet, check food labels. No salt added.  Stay well hydrated.  ------------- Start nasal steroid Flonase 2 sprays in each nostril daily for 4-6 weeks, may repeat course seasonally or as needed    1. You have symptoms of Vertigo (Benign Paroxysmal Positional Vertigo) - This is commonly caused by inner ear fluid imbalance, sometimes can be worsened by allergies and sinus symptoms, otherwise it can occur randomly sometimes and we may never discover the exact cause. - To treat this, try the Epley Manuever (see diagrams/instructions below) at home up to 3 times a day for 1-2 weeks or until symptoms resolve - You may take Meclizine as needed up to 3 times a day for dizziness, this will not cure symptoms but may help. Caution may make you drowsy.  If you develop significant worsening episode with vertigo that does not improve and you get severe headache, loss of vision, arm or leg weakness, slurred speech, or other concerning symptoms please seek immediate medical attention at Emergency Department.  Please schedule a follow-up appointment with Dr Parks Ranger within 4 weeks if Vertigo not improving, and will consider Referral to Vestibular Rehab  At [facility] PT  See the next page for images describing the Epley Manuever.     ----------------------------------------------------------------------------------------------------------------------      Most likely skin spots are related to these benign lesions. - the one on back for sure looks like an SK - the scalp is more pale likely due to hair and it is in early stage it is benign or normal appearing - spot behind ear looks more like a cyst  Seborrheic keratosis (seb-o-REE-ik care-uh-TOE-sis) is a common skin growth. It may seem worrisome because it can look like a wart,  pre-cancerous skin growth (actinic keratosis), or skin cancer. Despite their appearance, seborrheic keratoses are harmless. Most people get these growths when they are middle aged or older. Because they begin at a later age and can have a wart-like appearance, seborrheic keratoses are often called the "barnacles of aging." It's possible to have just one of these growths, but most people develop several. Some growths may have a warty surface while others look like dabs of warm, brown candle wax on the skin. Seborrheic keratoses range in color from white to black; however, most are tan or brown. You can find these harmless growths anywhere on the skin, except the palms and soles. Most often, you'll see them on the chest, back, head, or neck.  -------   Colon Cancer Screening: - For all adults age 50+ routine colon cancer screening is highly recommended.     - Recent guidelines from [place] recommend starting age of 51 - Early detection of colon cancer is important, because often there are no warning signs or symptoms, also if found early usually it can be cured. Late stage is hard to treat.  - If you are not interested in Colonoscopy screening (if done and normal you could be cleared for 5 to 10 years until next due), then Cologuard is an excellent alternative for screening test for Colon Cancer. It is highly sensitive for detecting DNA of colon cancer from even the earliest stages. Also, there is NO bowel prep required. - If Cologuard is NEGATIVE, then it is good for 3 years before next due - If Cologuard is POSITIVE, then  it is strongly advised to get a Colonoscopy, which allows the GI doctor to locate the source of the cancer or polyp (even very early stage) and treat it by removing it. ------------------------- If you would like to proceed with Cologuard (stool DNA test) - FIRST, call your insurance company and tell them you want to check cost of Cologuard tell them CPT Code  [phone] (it may be completely covered and you could get for no cost, OR max cost without any coverage is about $600). Also, keep in mind if you do NOT open the kit, and decide not to do the test, you will NOT be charged, you should contact the company if you decide not to do the test. - If you want to proceed, you can notify us (phone message, [place], or at next visit) and we will order it for you. The test kit will be delivered to you house within about 1 week. Follow instructions to collect sample, you may call the company for any help or questions, 24/7 telephone support at [phone].   Please schedule a Follow-up Appointment to: Return in about 4 weeks (around 10/23/2018) for Annual Physical.  If you have any other questions or concerns, please feel free to call the office or send a message through [place]. You may also schedule an earlier appointment if necessary.  Additionally, you may be receiving a survey about your experience at our office within a few days to 1 week by e-mail or mail. We value your feedback.  Nobie Putnam, DO [facility], [facility]   Low-Sodium Eating Plan Sodium, which is an element that makes up salt, helps you maintain a healthy balance of fluids in your body. Too much sodium can increase your blood pressure and cause fluid and waste to be held in your body. Your health care provider or dietitian may recommend following this plan if you have high blood pressure (hypertension), kidney disease, liver disease, or heart failure. Eating less sodium can help lower your blood pressure, reduce swelling, and protect your heart, liver, and kidneys. What are tips for following this plan? General guidelines  Most people on this plan should limit their sodium intake to 1,500-2,000 mg (milligrams) of sodium each day. Reading food labels   The Nutrition Facts label lists the amount of sodium in one serving of the food. If you eat more than one  serving, you must multiply the listed amount of sodium by the number of servings.  Choose foods with less than 140 mg of sodium per serving.  Avoid foods with 300 mg of sodium or more per serving. Shopping  Look for lower-sodium products, often labeled as "low-sodium" or "no salt added."  Always check the sodium content even if foods are labeled as "unsalted" or "no salt added".  Buy fresh foods. ? Avoid canned foods and premade or frozen meals. ? Avoid canned, cured, or processed meats  Buy breads that have less than 80 mg of sodium per slice. Cooking  Eat more home-cooked food and less restaurant, buffet, and fast food.  Avoid adding salt when cooking. Use salt-free seasonings or herbs instead of table salt or sea salt. Check with your health care provider or pharmacist before using salt substitutes.  Cook with plant-based oils, such as canola, sunflower, or olive oil. Meal planning  When eating at a restaurant, ask that your food be prepared with less salt or no salt, if possible.  Avoid foods that contain MSG (monosodium glutamate). MSG is sometimes  added to [place] food, bouillon, and some canned foods. What foods are recommended? The items listed may not be a complete list. Talk with your dietitian about what dietary choices are best for you. Grains Low-sodium cereals, including oats, puffed wheat and rice, and shredded wheat. Low-sodium crackers. Unsalted rice. Unsalted pasta. Low-sodium bread. Whole-grain breads and whole-grain pasta. Vegetables Fresh or frozen vegetables. "No salt added" canned vegetables. "No salt added" tomato sauce and paste. Low-sodium or reduced-sodium tomato and vegetable juice. Fruits Fresh, frozen, or canned fruit. Fruit juice. Meats and other protein foods Fresh or frozen (no salt added) meat, poultry, seafood, and fish. Low-sodium canned tuna and salmon. Unsalted nuts. Dried peas, beans, and lentils without added salt. Unsalted canned beans.  Eggs. Unsalted nut butters. Dairy Milk. Soy milk. Cheese that is naturally low in sodium, such as ricotta cheese, fresh mozzarella, or Swiss cheese Low-sodium or reduced-sodium cheese. Cream cheese. Yogurt. Fats and oils Unsalted butter. Unsalted margarine with no trans fat. Vegetable oils such as canola or olive oils. Seasonings and other foods Fresh and dried herbs and spices. Salt-free seasonings. Low-sodium mustard and ketchup. Sodium-free salad dressing. Sodium-free light mayonnaise. Fresh or refrigerated horseradish. Lemon juice. Vinegar. Homemade, reduced-sodium, or low-sodium soups. Unsalted popcorn and pretzels. Low-salt or salt-free chips. What foods are not recommended? The items listed may not be a complete list. Talk with your dietitian about what dietary choices are best for you. Grains Instant hot cereals. Bread stuffing, pancake, and biscuit mixes. Croutons. Seasoned rice or pasta mixes. Noodle soup cups. Boxed or frozen macaroni and cheese. Regular salted crackers. Self-rising flour. Vegetables Sauerkraut, pickled vegetables, and relishes. Olives. [place] fries. Onion rings. Regular canned vegetables (not low-sodium or reduced-sodium). Regular canned tomato sauce and paste (not low-sodium or reduced-sodium). Regular tomato and vegetable juice (not low-sodium or reduced-sodium). Frozen vegetables in sauces. Meats and other protein foods Meat or fish that is salted, canned, smoked, spiced, or pickled. Bacon, ham, sausage, hotdogs, corned beef, chipped beef, packaged lunch meats, salt pork, jerky, pickled herring, anchovies, regular canned tuna, sardines, salted nuts. Dairy Processed cheese and cheese spreads. Cheese curds. Blue cheese. Feta cheese. String cheese. Regular cottage cheese. Buttermilk. Canned milk. Fats and oils Salted butter. Regular margarine. Ghee. Bacon fat. Seasonings and other foods Onion salt, garlic salt, seasoned salt, table salt, and sea salt. Canned and  packaged gravies. Worcestershire sauce. Tartar sauce. Barbecue sauce. Teriyaki sauce. Soy sauce, including reduced-sodium. Steak sauce. Fish sauce. Oyster sauce. Cocktail sauce. Horseradish that you find on the shelf. Regular ketchup and mustard. Meat flavorings and tenderizers. Bouillon cubes. Hot sauce and Tabasco sauce. Premade or packaged marinades. Premade or packaged taco seasonings. Relishes. Regular salad dressings. Salsa. Potato and tortilla chips. Corn chips and puffs. Salted popcorn and pretzels. Canned or dried soups. Pizza. Frozen entrees and pot pies. Summary  Eating less sodium can help lower your blood pressure, reduce swelling, and protect your heart, liver, and kidneys.  Most people on this plan should limit their sodium intake to 1,500-2,000 mg (milligrams) of sodium each day.  Canned, boxed, and frozen foods are high in sodium. Restaurant foods, fast foods, and pizza are also very high in sodium. You also get sodium by adding salt to food.  Try to cook at home, eat more fresh fruits and vegetables, and eat less fast food, canned, processed, or prepared foods. This information is not intended to replace advice given to you by your health care provider. Make sure you discuss any questions you have with your  health care provider. Document Released: 01/18/2002 Document Revised: 07/22/2016 Document Reviewed: 07/22/2016 Elsevier Interactive Patient Education  2019 [employer].

## 2018-09-25 NOTE — Progress Notes (Signed)
Subjective:    Patient ID: Timothy Ramalobert W Luten, male    DOB: January 28, 1966, [age] y.o.   MRN: [id number]  Timothy Lang is a 53 y.o. male presenting on 09/25/2018 for Establish Care (knee surgery 08/20/18, recently dizziness, hot flashes ,disoriented)  Previous PCP >5+ years ago Dr Sherwood [facility]usco[facility]  HPI   Elevated BP without dx of Hypertension Reports history of some elevated BP. In past never dx with HTN. Worse recently. Not checking regularly Current Meds - never on med   Lifestyle: - Diet: balanced, but not following low sodium diet - Exercise: less active in winter, does some hunting Denies CP, dyspnea, HA, edema, lightheadedness  BPPV / Dizziness / Off Balance Worse in AM with sudden movement and head position, seems episodic, recent worse for few days episodes similar in past, had before it was self limited. Has not tried medicine.  S/p Knee Surgery, Left - history of torn meniscus - Prior initial arthroscopic repair partially removed meniscus 04/2017, about 1.5 year later he still had problem with meniscus, ultimately had MRI showed meniscus still in place in wrong place, went to [facility] Dr Marcene CorningPeter Dalldorf - he has already been seen for post op 1/20 and then again 2/19, he was treated initially with Hydrocodone PRN briefly post op. - [facility] coming [facility]  Overweight BMI >28  Seborrheic Keratoses Reports multiple skin lesions he wanted to be evaluated today. One larger dark spot on back, other on scalp and behind ear  Allergic Rhinosinusitis Reports sinus congestion, seems fairly persistent with some pressure headache, nasal congestion, L >R   History of Bleeding Anal Polyp Reports he had a surgical procedure to remove this, and it was packed to treat it.  Health Maintenance: Request colon cancer screening, he would like cologuard.  Depression screen PHQ 2/9 09/25/2018  Decreased Interest 0  Down, Depressed,  Hopeless 0  PHQ - 2 Score 0    Past Medical History:  Diagnosis Date  . Medical history non-contributory    Past Surgical History:  Procedure Laterality Date  . KNEE ARTHROSCOPY WITH MEDIAL MENISECTOMY Left 04/16/2017   Procedure: KNEE ARTHROSCOPY WITH PARTIAL MEDIAL MENISECTOMY, CHONDROPLASTY, PARTIAL SYNOVECTOMY;  Surgeon: Lyndle HerrlichBowers, James R, MD;  Location: [facility] ORS;  Service: Orthopedics;  Laterality: Left;  . SURGERY OF LIP  2008  . TONSILLECTOMY  1974  . TOOTH EXTRACTION  2008  . vasectomy  1997   Social History   Socioeconomic History  . Marital status: Single    Spouse name: Not on file  . Number of children: Not on file  . Years of education: Not on file  . Highest education level: Not on file  Occupational History  . Not on file  Social Needs  . Financial resource strain: Not on file  . Food insecurity:    Worry: Not on file    Inability: Not on file  . Transportation needs:    Medical: Not on file    Non-medical: Not on file  Tobacco Use  . Smoking status: Former Smoker    Packs/day: 0.50    Types: Cigarettes    Last attempt to quit: 04/15/2016    Years since quitting: 2.4  . Smokeless tobacco: Former Engineer, waterUser  Substance and Sexual Activity  . Alcohol use: No  . Drug use: No  . Sexual activity: Not on file  Lifestyle  . Physical activity:    Days per week: Not on file  Minutes per session: Not on file  . Stress: Not on file  Relationships  . Social connections:    Talks on phone: Not on file    Gets together: Not on file    Attends religious service: Not on file    Active member of club or organization: Not on file    Attends meetings of clubs or organizations: Not on file    Relationship status: Not on file  . Intimate partner violence:    Fear of current or ex partner: Not on file    Emotionally abused: Not on file    Physically abused: Not on file    Forced sexual activity: Not on file  Other Topics Concern  . Not on file  Social History Narrative   . Not on file   Family History  Problem Relation Age of Onset  . Breast cancer Mother    Current Outpatient Medications on File Prior to Visit  Medication Sig  . acetaminophen (TYLENOL) 500 MG tablet Take 1,000 mg by mouth every 6 (six) hours as needed.  Marland Kitchen aspirin EC 81 MG tablet Take by mouth.  . esomeprazole (NEXIUM) 20 MG packet Take 20 mg by mouth daily before breakfast.   No current facility-administered medications on file prior to visit.     Review of Systems Per HPI unless specifically indicated above     Objective:    BP 134/88 (BP Location: Left Arm, Cuff Size: Normal)   Pulse 75   Temp 98.5 F (36.9 C) (Oral)   Resp 16   Ht 6' (1.829 m)   Wt 210 lb 9.6 oz (95.5 kg)   BMI 28.56 kg/m   Wt Readings from Last 3 Encounters:  09/25/18 210 lb 9.6 oz (95.5 kg)  04/16/17 200 lb (90.7 kg)  04/15/17 200 lb (90.7 kg)    Physical Exam Vitals signs and nursing note reviewed.  Constitutional:      General: He is not in acute distress.    Appearance: He is well-developed. He is not diaphoretic.     Comments: Well-appearing, comfortable, cooperative  HENT:     Head: Normocephalic and atraumatic.  Eyes:     General:        Right eye: No discharge.        Left eye: No discharge.     Conjunctiva/sclera: Conjunctivae normal.  Cardiovascular:     Rate and Rhythm: Normal rate.  Pulmonary:     Effort: Pulmonary effort is normal.  Skin:    General: Skin is warm and dry.     Findings: No erythema or rash.  Neurological:     Mental Status: He is alert and oriented to person, place, and time.  Psychiatric:        Behavior: Behavior normal.     Comments: Well groomed, good eye contact, normal speech and thoughts    No results found for this or any previous visit.    Assessment & Plan:   Problem List Items Addressed This Visit    Benign paroxysmal positional vertigo due to bilateral vestibular disorder - Primary  Suspected b/l episodic-BPPV by history - No other  significant neurological findings or focal deficits  Plan: 1. Handout given with Epley maneuver TID for 1-2 weeks until resolved 2. Rx meclizine PRN for breakthrough symptoms 3. Return criteria, if not improved consider vestibular PT referral    Relevant Medications   meclizine (ANTIVERT) 25 MG tablet   Elevated BP without diagnosis of hypertension  Improved on  re-check Recommend check BP outside office Follow-up within 4-6 weeks, labs, and consider possible HTN diagnosis    Other Visit Diagnoses    Encounter to establish care with new doctor     Request prior PCP records    Screening for colon cancer     Due for routine colon cancer screening. Never had colonoscopy, no family history colon cancer. - Discussion today about recommendations for either Colonoscopy or Cologuard screening, benefits and risks of screening, interested in Cologuard, understands that if positive then recommendation is for diagnostic colonoscopy to follow-up. - Ordered Cologuard today     Relevant Orders   Cologuard   Seasonal allergic rhinitis due to other allergic trigger       Relevant Medications   fluticasone (FLONASE) 50 MCG/ACT nasal spray      Meds ordered this encounter  Medications  . meclizine (ANTIVERT) 25 MG tablet    Sig: Take 1 tablet (25 mg total) by mouth 3 (three) times daily as needed for dizziness.    Dispense:  30 tablet    Refill:  1  . fluticasone (FLONASE) 50 MCG/ACT nasal spray    Sig: Place 2 sprays into both nostrils daily. Use for 4-6 weeks then stop and use seasonally or as needed.    Dispense:  16 g    Refill:  3     Follow up plan: Return in about 4 weeks (around 10/23/2018) for Annual Physical.  Future labs ordered for 10/20/18.  Saralyn Pilar, DO [facility] 09/25/2018, 10:33 AM

## 2018-09-27 ENCOUNTER — Other Ambulatory Visit: Payer: Self-pay | Admitting: Family Medicine

## 2018-09-27 DIAGNOSIS — Z0000 Encounter for general adult medical examination without abnormal findings: Secondary | ICD-10-CM

## 2018-09-27 DIAGNOSIS — Z114 Encounter for screening for human immunodeficiency virus [HIV]: Secondary | ICD-10-CM

## 2018-09-27 DIAGNOSIS — R030 Elevated blood-pressure reading, without diagnosis of hypertension: Secondary | ICD-10-CM

## 2018-09-27 DIAGNOSIS — Z125 Encounter for screening for malignant neoplasm of prostate: Secondary | ICD-10-CM

## 2018-10-20 ENCOUNTER — Other Ambulatory Visit: Payer: BLUE CROSS/BLUE SHIELD

## 2018-10-20 DIAGNOSIS — Z125 Encounter for screening for malignant neoplasm of prostate: Secondary | ICD-10-CM | POA: Diagnosis not present

## 2018-10-20 DIAGNOSIS — Z0000 Encounter for general adult medical examination without abnormal findings: Secondary | ICD-10-CM | POA: Diagnosis not present

## 2018-10-20 DIAGNOSIS — Z114 Encounter for screening for human immunodeficiency virus [HIV]: Secondary | ICD-10-CM | POA: Diagnosis not present

## 2018-10-20 DIAGNOSIS — R030 Elevated blood-pressure reading, without diagnosis of hypertension: Secondary | ICD-10-CM | POA: Diagnosis not present

## 2018-10-21 LAB — CBC WITH DIFFERENTIAL/PLATELET
Absolute Monocytes: 570 cells/uL (ref 200–950)
Basophils Absolute: 72 cells/uL (ref 0–200)
Basophils Relative: 1.2 %
Eosinophils Absolute: 240 cells/uL (ref 15–500)
Eosinophils Relative: 4 %
HCT: 44.5 % (ref 38.5–50.0)
Hemoglobin: 15 g/dL (ref 13.2–17.1)
Lymphs Abs: 2436 cells/uL (ref 850–3900)
MCH: 29.6 pg (ref 27.0–33.0)
MCHC: 33.7 g/dL (ref 32.0–36.0)
MCV: 87.8 fL (ref 80.0–100.0)
MPV: 10.3 fL (ref 7.5–12.5)
Monocytes Relative: 9.5 %
Neutro Abs: 2682 cells/uL (ref 1500–7800)
Neutrophils Relative %: 44.7 %
Platelets: 299 10*3/uL (ref 140–400)
RBC: 5.07 10*6/uL (ref 4.20–5.80)
RDW: 13.1 % (ref 11.0–15.0)
Total Lymphocyte: 40.6 %
WBC: 6 10*3/uL (ref 3.8–10.8)

## 2018-10-21 LAB — COMPLETE METABOLIC PANEL WITH GFR
AG Ratio: 1.7 (calc) (ref 1.0–2.5)
ALT: 44 U/L (ref 9–46)
AST: 36 U/L — ABNORMAL HIGH (ref 10–35)
Albumin: 4.3 g/dL (ref 3.6–5.1)
Alkaline phosphatase (APISO): 103 U/L (ref 35–144)
BUN: 12 mg/dL (ref 7–25)
CO2: 23 mmol/L (ref 20–32)
Calcium: 9.4 mg/dL (ref 8.6–10.3)
Chloride: 106 mmol/L (ref 98–110)
Creat: 0.95 mg/dL (ref 0.70–1.33)
GFR, Est African American: 106 mL/min/{1.73_m2} (ref >=60)
GFR, Est Non African American: 92 mL/min/{1.73_m2} (ref >=60)
Globulin: 2.6 g/dL (calc) (ref 1.9–3.7)
Glucose, Bld: 116 mg/dL — ABNORMAL HIGH (ref 65–99)
Potassium: 4.4 mmol/L (ref 3.5–5.3)
Sodium: 140 mmol/L (ref 135–146)
Total Bilirubin: 0.4 mg/dL (ref 0.2–1.2)
Total Protein: 6.9 g/dL (ref 6.1–8.1)

## 2018-10-21 LAB — HEMOGLOBIN A1C
Hgb A1c MFr Bld: 5.3 % of total Hgb (ref <5.7)
Mean Plasma Glucose: 105 (calc)
eAG (mmol/L): 5.8 (calc)

## 2018-10-21 LAB — LIPID PANEL
Cholesterol: 257 mg/dL — ABNORMAL HIGH (ref <200)
HDL: 41 mg/dL (ref >=40)
LDL Cholesterol (Calc): 178 mg/dL (calc) — ABNORMAL HIGH
Non-HDL Cholesterol (Calc): 216 mg/dL (calc) — ABNORMAL HIGH (ref <130)
Total CHOL/HDL Ratio: 6.3 (calc) — ABNORMAL HIGH (ref <5.0)
Triglycerides: 223 mg/dL — ABNORMAL HIGH (ref <150)

## 2018-10-21 LAB — TSH: TSH: 1.73 mIU/L (ref 0.40–4.50)

## 2018-10-21 LAB — PSA: PSA: 0.4 ng/mL (ref <=4.0)

## 2018-10-21 LAB — HIV ANTIBODY (ROUTINE TESTING W REFLEX): HIV 1&2 Ab, 4th Generation: NONREACTIVE

## 2018-10-26 ENCOUNTER — Ambulatory Visit (INDEPENDENT_AMBULATORY_CARE_PROVIDER_SITE_OTHER): Payer: BLUE CROSS/BLUE SHIELD | Admitting: Family Medicine

## 2018-10-26 ENCOUNTER — Encounter: Payer: Self-pay | Admitting: Family Medicine

## 2018-10-26 ENCOUNTER — Other Ambulatory Visit: Payer: Self-pay

## 2018-10-26 ENCOUNTER — Other Ambulatory Visit: Payer: Self-pay | Admitting: Family Medicine

## 2018-10-26 VITALS — BP 138/82 | HR 71 | Temp 98.3°F | Resp 16 | Ht 72.0 in | Wt 217.6 lb

## 2018-10-26 DIAGNOSIS — Z0000 Encounter for general adult medical examination without abnormal findings: Secondary | ICD-10-CM | POA: Diagnosis not present

## 2018-10-26 DIAGNOSIS — I10 Essential (primary) hypertension: Secondary | ICD-10-CM | POA: Diagnosis not present

## 2018-10-26 DIAGNOSIS — E782 Mixed hyperlipidemia: Secondary | ICD-10-CM | POA: Insufficient documentation

## 2018-10-26 DIAGNOSIS — Z1211 Encounter for screening for malignant neoplasm of colon: Secondary | ICD-10-CM

## 2018-10-26 DIAGNOSIS — H8113 Benign paroxysmal vertigo, bilateral: Secondary | ICD-10-CM

## 2018-10-26 MED ORDER — AMLODIPINE BESYLATE 5 MG PO TABS: 5.0000 mg | ORAL_TABLET | Freq: Every day | ORAL | 1 refills | Status: DC

## 2018-10-26 NOTE — Assessment & Plan Note (Addendum)
Persistently elevated BP, based on home and office readings, consistent with new dx HTN - Home BP readings 130-140 on average, DBP nearly 90  No known complications     Plan:  1. New start Amlodipine 5mg  daily - discuss potential side effect swelling, benefits - caution if lower BP 2. Encourage improved lifestyle - keep on low sodium diet, regular exercise 3. Continue monitor BP outside office, bring readings to next visit, if persistently >140/90 or new symptoms notify office sooner, caution low BP as well with known vertigo / dizziness 4. 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

## 2018-10-26 NOTE — Progress Notes (Signed)
Subjective:    Patient ID: Timothy Lang, male    DOB: Aug 22, 1965, 53 y.o.   MRN: [id number]  Timothy Lang is a 53 y.o. male presenting on 10/26/2018 for Annual Exam  HPI   Here for Annual Physical and Lab Review. 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  Elevated BP without dx of Hypertension Reports history of some elevated BP. In past never dx with HTN. Worse recently. Not checking regularly Current Meds - never on med   Lifestyle: - Diet: He has quit sodium completely. Otherwise improving diet healthier options - Exercise: less active in winter, does some hunting Denies CP, dyspnea, HA, edema, lightheadedness  BPPV / Dizziness / Off Balance - Significantly improved   Worse in AM with sudden movement and head position, seems episodic, recent worse for few days episodes similar in past, had before it was self limited. Has not tried medicine.  S/p Knee Surgery, Left - history of torn meniscus - Prior initial arthroscopic repair partially removed meniscus 04/2017, about 1.5 year later he still had problem with meniscus, ultimately had MRI showed meniscus still in place in wrong place, went to [facility] Dr Melrose Nakayama - he has already been seen for post op 1/20 and then again 2/19, he was treated initially with Hydrocodone PRN briefly post op. - He will start PT at [facility] therapy this coming Monday  Overweight BMI >28  Seborrheic Keratoses Reports multiple skin lesions he wanted to be evaluated today. One larger dark spot on back, other on scalp and behind ear  Allergic Rhinosinusitis Reports sinus congestion, seems fairly persistent with some pressure headache, nasal congestion, L >R   History of Bleeding Anal Polyp Reports he had a surgical procedure to remove this, and it was packed to treat it.  Health Maintenance: Request colon cancer screening, he would like cologuard.   Depression screen [facility] 2/9 10/26/2018 09/25/2018   Decreased Interest 0 0  Down, Depressed, Hopeless 0 0  PHQ - 2 Score 0 0    History reviewed. No pertinent past medical history. Past Surgical History:  Procedure Laterality Date  . KNEE ARTHROSCOPY WITH MEDIAL MENISECTOMY Left 04/16/2017   Procedure: KNEE ARTHROSCOPY WITH PARTIAL MEDIAL MENISECTOMY, CHONDROPLASTY, PARTIAL SYNOVECTOMY;  Surgeon: Lovell Sheehan, MD;  Location: [facility] ORS;  Service: Orthopedics;  Laterality: Left;  . SURGERY OF LIP  2008  . TONSILLECTOMY  1974  . TOOTH EXTRACTION  2008  . vasectomy  1997   Social History   Socioeconomic History  . Marital status: Single    Spouse name: Not on file  . Number of children: Not on file  . Years of education: Not on file  . Highest education level: Not on file  Occupational History  . Not on file  Social Needs  . Financial resource strain: Not on file  . Food insecurity:    Worry: Not on file    Inability: Not on file  . Transportation needs:    Medical: Not on file    Non-medical: Not on file  Tobacco Use  . Smoking status: Former Smoker    Packs/day: 0.50    Types: Cigarettes    Last attempt to quit: 04/15/2016    Years since quitting: 2.5  . Smokeless tobacco: Former Network engineer and Sexual Activity  . Alcohol use: No  . Drug use: No  . Sexual activity: Not on file  Lifestyle  . Physical activity:    Days  per week: Not on file    Minutes per session: Not on file  . Stress: Not on file  Relationships  . Social connections:    Talks on phone: Not on file    Gets together: Not on file    Attends religious service: Not on file    Active member of club or organization: Not on file    Attends meetings of clubs or organizations: Not on file    Relationship status: Not on file  . Intimate partner violence:    Fear of current or ex partner: Not on file    Emotionally abused: Not on file    Physically abused: Not on file    Forced sexual activity: Not on file  Other Topics Concern  . Not on file   Social History Narrative  . Not on file   Family History  Problem Relation Age of Onset  . Breast cancer Mother    Current Outpatient Medications on File Prior to Visit  Medication Sig  . acetaminophen (TYLENOL) 500 MG tablet Take 1,000 mg by mouth every 6 (six) hours as needed.  Marland Kitchen aspirin EC 81 MG tablet Take by mouth.  . esomeprazole (NEXIUM) 20 MG packet Take 20 mg by mouth daily before breakfast.  . fluticasone (FLONASE) 50 MCG/ACT nasal spray Place 2 sprays into both nostrils daily. Use for 4-6 weeks then stop and use seasonally or as needed.  . meclizine (ANTIVERT) 25 MG tablet Take 1 tablet (25 mg total) by mouth 3 (three) times daily as needed for dizziness.   No current facility-administered medications on file prior to visit.     Review of Systems Per HPI unless specifically indicated above     Objective:    BP 138/82 (BP Location: Left Arm, Cuff Size: Normal)   Pulse 71   Temp 98.3 F (36.8 C) (Oral)   Resp 16   Ht 6' (1.829 m)   Wt 217 lb 9.6 oz (98.7 kg)   BMI 29.51 kg/m   Wt Readings from Last 3 Encounters:  10/26/18 217 lb 9.6 oz (98.7 kg)  09/25/18 210 lb 9.6 oz (95.5 kg)  04/16/17 200 lb (90.7 kg)    Physical Exam Results for orders placed or performed in visit on 10/20/18  HIV Antibody (routine testing w rflx)  Result Value Ref Range   HIV 1&2 Ab, 4th Generation NON-REACTIVE NON-REACTI  TSH  Result Value Ref Range   TSH 1.73 0.40 - 4.50 mIU/L  PSA  Result Value Ref Range   PSA 0.4 < OR = 4.0 ng/mL  Lipid panel  Result Value Ref Range   Cholesterol 257 (H) <200 mg/dL   HDL 41 > OR = 40 mg/dL   Triglycerides 223 (H) <150 mg/dL   LDL Cholesterol (Calc) 178 (H) mg/dL (calc)   Total CHOL/HDL Ratio 6.3 (H) <5.0 (calc)   Non-HDL Cholesterol (Calc) 216 (H) <130 mg/dL (calc)  COMPLETE METABOLIC PANEL WITH GFR  Result Value Ref Range   Glucose, Bld 116 (H) 65 - 99 mg/dL   BUN 12 7 - 25 mg/dL   Creat 0.95 0.70 - 1.33 mg/dL   GFR, Est Non African  American 92 > OR = 60 mL/min/1.47m   GFR, Est African American 106 > OR = 60 mL/min/1.788m  BUN/Creatinine Ratio NOT APPLICABLE 6 - 22 (calc)   Sodium 140 135 - 146 mmol/L   Potassium 4.4 3.5 - 5.3 mmol/L   Chloride 106 98 - 110 mmol/L   CO2  23 20 - 32 mmol/L   Calcium 9.4 8.6 - 10.3 mg/dL   Total Protein 6.9 6.1 - 8.1 g/dL   Albumin 4.3 3.6 - 5.1 g/dL   Globulin 2.6 1.9 - 3.7 g/dL (calc)   AG Ratio 1.7 1.0 - 2.5 (calc)   Total Bilirubin 0.4 0.2 - 1.2 mg/dL   Alkaline phosphatase (APISO) 103 35 - 144 U/L   AST 36 (H) 10 - 35 U/L   ALT 44 9 - 46 U/L  CBC with Differential/Platelet  Result Value Ref Range   WBC 6.0 3.8 - 10.8 Thousand/uL   RBC 5.07 4.20 - 5.80 Million/uL   Hemoglobin 15.0 13.2 - 17.1 g/dL   HCT 44.5 38.5 - 50.0 %   MCV 87.8 80.0 - 100.0 fL   MCH 29.6 27.0 - 33.0 pg   MCHC 33.7 32.0 - 36.0 g/dL   RDW 13.1 11.0 - 15.0 %   Platelets 299 140 - 400 Thousand/uL   MPV 10.3 7.5 - 12.5 fL   Neutro Abs 2,682 1,500 - 7,800 cells/uL   Lymphs Abs 2,436 850 - 3,900 cells/uL   Absolute Monocytes 570 200 - 950 cells/uL   Eosinophils Absolute 240 15 - 500 cells/uL   Basophils Absolute 72 0 - 200 cells/uL   Neutrophils Relative % 44.7 %   Total Lymphocyte 40.6 %   Monocytes Relative 9.5 %   Eosinophils Relative 4.0 %   Basophils Relative 1.2 %  Hemoglobin A1c  Result Value Ref Range   Hgb A1c MFr Bld 5.3 <5.7 % of total Hgb   Mean Plasma Glucose 105 (calc)   eAG (mmol/L) 5.8 (calc)      Assessment & Plan:   Problem List Items Addressed This Visit    Benign paroxysmal positional vertigo due to bilateral vestibular disorder    Improved now s/p recent visit with Epley and Meclizine PRN No further episodes      Essential hypertension    Persistently elevated BP, based on home and office readings, consistent with new dx HTN - Home BP readings 130-140 on average, DBP nearly 90  No known complications     Plan:  1. New start Amlodipine 5m daily - discuss potential  side effect swelling, benefits - caution if lower BP 2. Encourage improved lifestyle - keep on low sodium diet, regular exercise 3. Continue monitor BP outside office, bring readings to next visit, if persistently >140/90 or new symptoms notify office sooner, caution low BP as well with known vertigo / dizziness 4. Follow-up sooner if need otherwise if stable, 6 months for labs  He will proceed with upcoming DOT Physical in April 2020, goal to keep BP controlled      Relevant Medications   amLODipine (NORVASC) 5 MG tablet   Mixed hyperlipidemia    Uncontrolled cholesterol elevated LDL, poor lifestyle but improving now Last lipid panel 10/2018 Calculated ASCVD 10 yr risk score 7-8%  Plan: 1. Never on statin - discuss options today, will defer and try lifestyle management first 2. Encourage improved lifestyle - low carb/cholesterol, reduce portion size, continue improving regular exercise - handout low chol diet  Follow-up 6 months fasting lab, repeat       Relevant Medications   amLODipine (NORVASC) 5 MG tablet    Other Visit Diagnoses    Annual physical exam    -  Primary   Screening for colon cancer          Updated Health Maintenance information  - Advised patient to  contact [employer] customer support - since his recent sample in past 1 month was determined to be inappropriate for testing, needs to be collected differently - he should be able to have new kit sent, out otherwise we can contact [employer] and re order.  Reviewed recent lab results with patient Encouraged improvement to lifestyle with diet and exercise - Goal of weight loss   Meds ordered this encounter  Medications  . amLODipine (NORVASC) 5 MG tablet    Sig: Take 1 tablet (5 mg total) by mouth daily.    Dispense:  90 tablet    Refill:  1    Follow up plan: Return in about 6 months (around 04/28/2019) for Annual Physical.  Future labs ordered for 04/26/19  Nobie Putnam, DO [facility] 10/26/2018, 9:05 AM

## 2018-10-26 NOTE — Assessment & Plan Note (Signed)
Improved now s/p recent visit with Epley and Meclizine PRN No further episodes

## 2018-10-26 NOTE — Patient Instructions (Addendum)
Thank you for coming to the office today.  Start Amlodipine '5mg'$  daily for blood pressure Check BP every day for now If too low < 100/60 and dizzy lightheaded may CUT IN HALF TABLET or can call and ask about it May take other medicine as is  Please call [employer] customer support to ask about your sample - the fax they recently sent me said that there was a collection problem, and they could probably tell you more information and likely send another Cologuard Kit free of charge. Sorry about the inconvenience.  Lab results  1. Chemistry - Normal results, including electrolytes, kidney and liver function. Mildly elevated fasting sugar  2. Hemoglobin A1c (Diabetes screening) - 5.3, normal not in range of Pre-Diabetes (>5.7 to 6.4)   3. Routine screening HIV - Negative.  4. PSA Prostate Cancer Screening - 0.4, negative.  TSH Thyroid Function Tests - Normal.  5. Cholesterol - Abnormal cholesterol results. Low normal 41 HDL (good cholesterol), elevated 178 LDL (bad cholesterol), and elevated 223 Triglycerides.   6. CBC Blood Counts - Normal, no anemia, other abnormality   DUE for FASTING BLOOD WORK (no food or drink after midnight before the lab appointment, only water or coffee without cream/sugar on the morning of)  SCHEDULE "Lab Only" visit in the morning at the clinic for lab draw in 6 MONTHS   - Make sure Lab Only appointment is at about 1 week before your next appointment, so that results will be available  For Lab Results, once available within 2-3 days of blood draw, you can can log in to MyChart online to view your results and a brief explanation. Also, we can discuss results at next follow-up visit.    Please schedule a Follow-up Appointment to: Return in about 6 months (around 04/28/2019) for Annual Physical.  If you have any other questions or concerns, please feel free to call the office or send a message through [place]. You may also schedule an earlier appointment if  necessary.  Additionally, you may be receiving a survey about your experience at our office within a few days to 1 week by e-mail or mail. We value your feedback.  Nobie Putnam, DO [facility], [facility]   Cholesterol Content in Foods Cholesterol is a waxy, fat-like substance that helps to carry fat in the blood. The body needs cholesterol in small amounts, but too much cholesterol can cause damage to the arteries and heart. Most people should eat less than 200 milligrams (mg) of cholesterol a day. Foods with cholesterol  Cholesterol is found in animal-based foods, such as meat, seafood, and dairy. Generally, low-fat dairy and lean meats have less cholesterol than full-fat dairy and fatty meats. The milligrams of cholesterol per serving (mg per serving) of common cholesterol-containing foods are listed below. Meat and other proteins  Egg - one large whole egg has 186 mg.  Veal shank - 4 oz has 141 mg.  Lean ground [place] (93% lean) - 4 oz has 118 mg.  Fat-trimmed lamb loin - 4 oz has 106 mg.  Lean ground beef (90% lean) - 4 oz has 100 mg.  Lobster - 3.5 oz has 90 mg.  Pork loin chops - 4 oz has 86 mg.  Canned salmon - 3.5 oz has 83 mg.  Fat-trimmed beef top loin - 4 oz has 78 mg.  Frankfurter - 1 frank (3.5 oz) has 77 mg.  Crab - 3.5 oz has 71 mg.  Roasted chicken without skin, white meat -  4 oz has 66 mg.  Light bologna - 2 oz has 45 mg.  Deli-cut [place] - 2 oz has 31 mg.  Canned tuna - 3.5 oz has 31 mg.  Bacon - 1 oz has 29 mg.  Oysters and mussels (raw) - 3.5 oz has 25 mg.  Mackerel - 1 oz has 22 mg.  Trout - 1 oz has 20 mg.  Pork sausage - 1 link (1 oz) has 17 mg.  Salmon - 1 oz has 16 mg.  Tilapia - 1 oz has 14 mg. Dairy  Soft-serve ice cream -  cup (4 oz) has 103 mg.  Whole-milk yogurt - 1 cup (8 oz) has 29 mg.  Cheddar cheese - 1 oz has 28 mg.  American cheese - 1 oz has 28 mg.  Whole milk - 1 cup (8 oz) has 23 mg.  2%  milk - 1 cup (8 oz) has 18 mg.  Cream cheese - 1 tablespoon (Tbsp) has 15 mg.  Cottage cheese -  cup (4 oz) has 14 mg.  Low-fat (1%) milk - 1 cup (8 oz) has 10 mg.  Sour cream - 1 Tbsp has 8.5 mg.  Low-fat yogurt - 1 cup (8 oz) has 8 mg.  Nonfat Greek yogurt - 1 cup (8 oz) has 7 mg.  Half-and-half cream - 1 Tbsp has 5 mg. Fats and oils  Cod liver oil - 1 tablespoon (Tbsp) has 82 mg.  Butter - 1 Tbsp has 15 mg.  Lard - 1 Tbsp has 14 mg.  Bacon grease - 1 Tbsp has 14 mg.  Mayonnaise - 1 Tbsp has 5-10 mg.  Margarine - 1 Tbsp has 3-10 mg. Exact amounts of cholesterol in these foods may vary depending on specific ingredients and brands. Foods without cholesterol Most plant-based foods do not have cholesterol unless you combine them with a food that has cholesterol. Foods without cholesterol include:  Grains and cereals.  Vegetables.  Fruits.  Vegetable oils, such as olive, canola, and sunflower oil.  Legumes, such as peas, beans, and lentils.  Nuts and seeds.  Egg whites. Summary  The body needs cholesterol in small amounts, but too much cholesterol can cause damage to the arteries and heart.  Most people should eat less than 200 milligrams (mg) of cholesterol a day. This information is not intended to replace advice given to you by your health care provider. Make sure you discuss any questions you have with your health care provider. Document Released: 03/25/2017 Document Revised: 03/25/2017 Document Reviewed: 03/25/2017 Elsevier Interactive Patient Education  [employer].

## 2018-10-26 NOTE — Assessment & Plan Note (Signed)
Uncontrolled cholesterol elevated LDL, poor lifestyle but improving now Last lipid panel 10/2018 Calculated ASCVD 10 yr risk score 7-8%  Plan: 1. Never on statin - discuss options today, will defer and try lifestyle management first 2. Encourage improved lifestyle - low carb/cholesterol, reduce portion size, continue improving regular exercise - handout low chol diet  Follow-up 6 months fasting lab, repeat

## 2019-01-06 DIAGNOSIS — Z1211 Encounter for screening for malignant neoplasm of colon: Secondary | ICD-10-CM | POA: Diagnosis not present

## 2019-01-06 DIAGNOSIS — Z1212 Encounter for screening for malignant neoplasm of rectum: Secondary | ICD-10-CM | POA: Diagnosis not present

## 2019-01-06 LAB — COLOGUARD: Cologuard: NEGATIVE

## 2019-01-13 ENCOUNTER — Encounter: Payer: Self-pay | Admitting: Family Medicine

## 2019-03-23 ENCOUNTER — Ambulatory Visit: Payer: BLUE CROSS/BLUE SHIELD | Admitting: Family Medicine

## 2019-03-23 ENCOUNTER — Ambulatory Visit (INDEPENDENT_AMBULATORY_CARE_PROVIDER_SITE_OTHER): Payer: Worker's Compensation | Admitting: Family Medicine

## 2019-03-23 ENCOUNTER — Encounter: Payer: Self-pay | Admitting: Family Medicine

## 2019-03-23 ENCOUNTER — Other Ambulatory Visit: Payer: Self-pay

## 2019-03-23 VITALS — BP 131/81 | HR 68 | Temp 98.4°F | Resp 16 | Ht 72.0 in | Wt 216.0 lb

## 2019-03-23 DIAGNOSIS — M722 Plantar fascial fibromatosis: Secondary | ICD-10-CM | POA: Diagnosis not present

## 2019-03-23 DIAGNOSIS — M545 Low back pain, unspecified: Secondary | ICD-10-CM

## 2019-03-23 MED ORDER — PREDNISONE 20 MG PO TABS: ORAL_TABLET | ORAL | 0 refills | Status: DC

## 2019-03-23 MED ORDER — CYCLOBENZAPRINE HCL 10 MG PO TABS: 5.0000 mg | ORAL_TABLET | Freq: Three times a day (TID) | ORAL | 1 refills | Status: DC | PRN

## 2019-03-23 NOTE — Patient Instructions (Addendum)
Thank you for coming to the office today.  1. For your Back Pain - I think that this is due to Muscle Spasms or strain.   2. Start Prednisone taper (steroid anti-inflammatory) for nerve irritation with pain in legs. - taper over 7 days. Do not take any Ibuprofen or Aleve while taking the Prednisone.  - Once finished Prednisone, then start with other anti-inflammatory Ibuprofen 600 or 800 mg per dose with food every 6 to 8 hours or 3 times a day, take it every day for at least 2 to 4 weeks then only as needed  3. Start Cyclobenzapine (Flexeril) 10mg  tablets - cut in half for 5mg  at night for muscle relaxant - may make you sedated or sleepy (be careful driving or working on this) if tolerated you can take every 8 hours, half or whole tab  4. May use Tylenol Extra Str 500mg  tabs - may take 1-2 tablets every 6 hours as needed  5. Recommend to start using heating pad on your lower back 1-2x daily for few weeks  This pain may take weeks to months to fully resolve, but hopefully it will respond to the medicine initially. All back injuries (small or serious) are slow to heal since we use our back muscles every day. Be careful with turning, twisting, lifting, sitting / standing for prolonged periods, and avoid re-injury.  If your symptoms significantly worsen with more pain, or new symptoms with weakness in one or both legs, new or different shooting leg pains, numbness in legs or groin, loss of control or retention of urine or bowel movements, please call back for advice and you may need to go directly to the Emergency Department.   Please schedule a Follow-up Appointment to: Return in about 4 weeks (around 04/20/2019), or if symptoms worsen or fail to improve, for back pain, plantar fascia.  If you have any other questions or concerns, please feel free to call the office or send a message through [place]. You may also schedule an earlier appointment if necessary.  Additionally, you may be receiving a  survey about your experience at our office within a few days to 1 week by e-mail or mail. We value your feedback.  Nobie Putnam, DO [facility]  ---------------------------------------------------------------------------  You most likely have Plantar Fasciitis of heel / foot. - This is inflammation of the fibrous connection on the bottom of the foot, and can have small micro tears over time that become painful. It usually will have flare ups lasting days to weeks, and may come back after it heals if it is re-aggravated again. - Often there is a bone spur or arthritis of the heel bone that causes this - Also it may be caused by abnormal footwear, walking pattern or other problems  If you are experiencing an acute flare with pain, this is usually worst first thing in the morning when the plantar fascia is tight and stiff. First step out of bed is painful usually, and it may gradually improve with stretching and walking.  Recommend: - Rest / relative rest with activity modification avoid overuse / prolonged stand - Ice packs (make sure you use a towel or sock / something to protect skin)  May also try topical muscle rub, icy hot, tiger balm  Start the exercises listed below, gradually increase them as instructed. May be sore at first and hopefully will stretch out and help reduce pain later.   [place] Address: [address]  [place], [place] [address] Hours: Open 8AM-5PM Phone: [phone]

## 2019-03-23 NOTE — Progress Notes (Signed)
Subjective:    Patient ID: Timothy Lang, male    DOB: 1965-11-20, 53 y.o.   MRN: [id number]  Timothy Lang is a 53 y.o. male presenting on 03/23/2019 for Back Pain (onset 2 days Sunday patient slipped at [place] and lower back pain yesterday and today is worst bilateral but more on Right side)  Patient presents for a same day appointment.  HPI   LOW BACK PAIN, bilateral / Fall Lumbar Strain injury - Reports symptoms started about 2 days ago with known inciting injury slipped at [place] and twisted his back, he did not hit the ground or fall but he did torque and twist his back with his legs slid out from under him and his upper body held on to car.  Today seems to be same as onset mostly persistent vs slightly worsening. Describes pain as aching pulling tightening, severe 9 out of 10, with intermittent worsening with deep breathing, laying on side, activity with walking, feels better bending forward to walk. Seems improved standing and improved with heating. No pain radiating to legs. - Taking Ibuprofen 800mg  every 6 hours PRN without relief. - Taking Tylenol 500mg  x 2 tabs q 6-8 hours PRN without relief - Tried heating pad with some relief. No relief from ice pack. - History of prior muscle strain in past brief only. Usually does not have chronic back pain. - No history of prior surgery - Admits difficulty sleeping due to pain - Denies any fevers/chills, numbness, tingling, weakness, loss of control bladder/bowel incontinence or retention, unintentional wt loss, night sweats  Right Foot Plantar Fasciitis History of R knee 2nd surgery in 08/2018, Dr Norm Salt [facility]. Recent worsening R foot / heel pain and pins and needles, worse with first steps in morning worse, or if he sits prolonged period. His friend is PT at [facility] he has done plantar fascia stretches already and limited relief. - He is interested in referral to Podiatrist Admits pain episodes paresthesia at  times in R foot and heel pain   Depression screen [facility] 2/9 03/23/2019 10/26/2018 09/25/2018  Decreased Interest 0 0 0  Down, Depressed, Hopeless 0 0 0  PHQ - 2 Score 0 0 0    Social History   Tobacco Use  . Smoking status: Former Smoker    Packs/day: 0.50    Types: Cigarettes    Quit date: 04/15/2016    Years since quitting: 2.9  . Smokeless tobacco: Former Network engineer Use Topics  . Alcohol use: No  . Drug use: No    Review of Systems Per HPI unless specifically indicated above     Objective:    BP 131/81   Pulse 68   Temp 98.4 F (36.9 C) (Oral)   Resp 16   Ht 6' (1.829 m)   Wt 216 lb (98 kg)   BMI 29.29 kg/m   Wt Readings from Last 3 Encounters:  03/23/19 216 lb (98 kg)  10/26/18 217 lb 9.6 oz (98.7 kg)  09/25/18 210 lb 9.6 oz (95.5 kg)    Physical Exam Vitals signs and nursing note reviewed.  Constitutional:      General: He is not in acute distress.    Appearance: He is well-developed. He is not diaphoretic.     Comments: Well-appearing, comfortable, cooperative  HENT:     Head: Normocephalic and atraumatic.  Eyes:     General:        Right eye: No discharge.  Left eye: No discharge.     Conjunctiva/sclera: Conjunctivae normal.  Cardiovascular:     Rate and Rhythm: Normal rate.  Pulmonary:     Effort: Pulmonary effort is normal.  Musculoskeletal:     Comments: Low Back Inspection: Normal appearance, no spinal deformity, symmetrical. Palpation: No tenderness over spinous processes. Bilateral lumbar paraspinal muscles mild tender R>L and with hypertonicity/spasm R>L ROM: Reduced active flexion and rotation due to pain Special Testing: Seated SLR with reproduced localized R back pain but negative for radicular pain. Strength: Bilateral hip flex/ext 5/5, knee flex/ext 5/5, ankle dorsiflex/plantarflex 5/5 Neurovascular: intact distal sensation to light touch  Right Foot Localized medial heel pain.  Skin:    General: Skin is warm and dry.      Findings: No erythema or rash.  Neurological:     Mental Status: He is alert and oriented to person, place, and time.  Psychiatric:        Behavior: Behavior normal.     Comments: Well groomed, good eye contact, normal speech and thoughts    Results for orders placed or performed in visit on 01/13/19  Cologuard  Result Value Ref Range   Cologuard Negative Negative      Assessment & Plan:   Problem List Items Addressed This Visit    None    Visit Diagnoses    Acute bilateral low back pain without sciatica    -  Primary  Acute bilateral R>L LBP without obvious associated sciatica. Suspect likely due to muscle spasm/strain, with known twisting injury to back muscle acute onset. - No known osteoarthritis or other known back problem - No red flag symptoms. Negative SLR for radiculopathy - Not responding to conservative therapy  Plan: 1. Start Prednisone taper 7 days as instructed, for both back and foot pain - HOLD Ibuprofen NSAID, can resume after steroid 2. Start muscle relaxant with Flexeril 10mg  tabs - take 5-10mg  up to TID PRN, titrate up as tolerated, caution sedation, primarily use at night 3. May use Tylenol PRN for breakthrough 4. Encouraged use of heating pad 1-2x daily for now then PRN 5. Follow-up 4-6 weeks if not improved for re-evaluation, consider X-ray imaging, trial of PT, and possibly referral to Orthopedic  Note for work given.     Relevant Medications   predniSONE (DELTASONE) 20 MG tablet   cyclobenzaprine (FLEXERIL) 10 MG tablet   Plantar fasciitis of right foot       Relevant Medications   predniSONE (DELTASONE) 20 MG tablet      Clinically consistent with subacute on chronic R plantar fasciitis based on history and exam, localized pain. Likely underlying etiology with increased weight and strain on R foot, s/p L knee surgery in early 2020 - No known injury. Unlikely fracture based on history, no bony tenderness. Not responding to conservative therapy   Plan: 1. Reviewed diagnosis and management of plantar fasciitis 2. See above - treat with Prednisone 1 week burst, then NSAID trial 3. May take Tylenol PRN breakthrough 4. May use topical muscle rub, ice 5. Emphasized importance of relative rest, ice, and avoid prolonged standing and overuse 6. Explained appropriate stretching and home exercises important to do first thing in morning and later  [facility] for further management by patient request.   Orders Placed This Encounter  Procedures  . Ambulatory referral to Podiatry    Referral Priority:   Routine    Referral Type:   Consultation    Referral Reason:   Specialty  Services Required    Requested Specialty:   Podiatry    Number of Visits Requested:   1      Meds ordered this encounter  Medications  . predniSONE (DELTASONE) 20 MG tablet    Sig: Take daily with food. Start with 60mg  (3 pills) x 2 days, then reduce to 40mg  (2 pills) x 2 days, then 20mg  (1 pill) x 3 days    Dispense:  13 tablet    Refill:  0  . cyclobenzaprine (FLEXERIL) 10 MG tablet    Sig: Take 0.5-1 tablets (5-10 mg total) by mouth 3 (three) times daily as needed for muscle spasms.    Dispense:  30 tablet    Refill:  1     Follow up plan: Return in about 4 weeks (around 04/20/2019), or if symptoms worsen or fail to improve, for back pain, plantar fascia.   Timothy PilarAlexander Jefferie Holston, DO [facility] 03/23/2019, 1:34 PM

## 2019-03-24 ENCOUNTER — Telehealth: Payer: Self-pay | Admitting: Family Medicine

## 2019-03-24 DIAGNOSIS — M545 Low back pain, unspecified: Secondary | ICD-10-CM

## 2019-03-24 MED ORDER — TRAMADOL HCL 50 MG PO TABS: 50.0000 mg | ORAL_TABLET | Freq: Three times a day (TID) | ORAL | 0 refills | Status: AC | PRN

## 2019-03-24 NOTE — Telephone Encounter (Signed)
Patient seen yesterday 03/23/19 for acute back strain. See note. He was rx Prednisone taper and Flexeril. He called back today only mild improve pain, still described significant pain keeping him up at night, worse with activity. I reiterated that it will take at least a few weeks to improve, up to 4-6 weeks, can take a week or more for current treatment to really improve his pain. I offered a temporary solution with Tramadol 50 TID PRN #15 pills 5 days at this time. Sent rx in, discussed risks and benefits and risks of opioid therapy. He has been on rare hydrocodone or oxycodone in past before post op before. He is comfortable with this plan. No further rx refill on this med, he needs to seek care if not improving.  Nobie Putnam, [facility] 03/24/2019, 1:34 PM

## 2019-04-26 ENCOUNTER — Other Ambulatory Visit: Payer: Self-pay

## 2019-04-26 ENCOUNTER — Ambulatory Visit (INDEPENDENT_AMBULATORY_CARE_PROVIDER_SITE_OTHER): Payer: Worker's Compensation

## 2019-04-26 ENCOUNTER — Other Ambulatory Visit: Payer: BC Managed Care – PPO

## 2019-04-26 ENCOUNTER — Ambulatory Visit (INDEPENDENT_AMBULATORY_CARE_PROVIDER_SITE_OTHER): Payer: Worker's Compensation | Admitting: Podiatry

## 2019-04-26 ENCOUNTER — Encounter: Payer: Self-pay | Admitting: Podiatry

## 2019-04-26 DIAGNOSIS — I10 Essential (primary) hypertension: Secondary | ICD-10-CM

## 2019-04-26 DIAGNOSIS — M722 Plantar fascial fibromatosis: Secondary | ICD-10-CM

## 2019-04-26 DIAGNOSIS — E782 Mixed hyperlipidemia: Secondary | ICD-10-CM

## 2019-04-26 MED ORDER — MELOXICAM 15 MG PO TABS: 15.0000 mg | ORAL_TABLET | Freq: Every day | ORAL | 3 refills | Status: DC

## 2019-04-26 NOTE — Progress Notes (Signed)
This patient presents the office with chief complaint of a painful right heel he states that the pain has been present for approximately 3 months.  He states he has numbness and pain upon rising in the morning as well as pain in the evening after today's activity.  He states that he had surgery performed on his left knee during the last few months and his medical doctor Nobie Putnam referred him to this office for treatment of his painful right heel.  He states that he has been under treatment for his physical therapy following his knee surgery.  He states that the therapist told him to roll a ball under his right foot ,  stretch his foot  and use ice in an effort to help to relieve his right heel pain.  Patient states that the pain persists.. Patient states that the pain he experiences is 7 out of 10.  He also has been given Deltasone by his medical doctor for his knee pain but he states that his right heel pain resolved.  He presents the office today for an evaluation and treatment of his right heel.  Vascular  Dorsalis pedis and posterior tibial pulses are palpable  B/L.  Capillary return  WNL.  Temperature gradient is  WNL.  Skin turgor  WNL  Sensorium  Senn Weinstein monofilament wire  WNL. Normal tactile sensation.  Nail Exam  Patient has normal nails with no evidence of bacterial or fungal infection.  Orthopedic  Exam  Muscle tone and muscle strength  WNL.  No limitations of motion feet  B/L.  No crepitus or joint effusion noted.  Foot type is unremarkable and digits show no abnormalities.  Bony prominences are unremarkable. Palpable pain at insertion plantar aspect right heel.  Swelling right heel noted.  Skin  No open lesions.  Normal skin texture and turgor.   Plantar fasciitis right heel.  IE.  Xray reveals minimal calcification at the insertion of the plantar fascia right heel.  Discussed this condition with this patient.  Told him to continue with stretching and icing of his  right foot.  Prescribed power step insoles to be worn in his shoes.  Prescribed Mobic 15 mg to be taken by mouth daily.  Injection right heel.Injection therapy using 1.0 cc. Of 2% xylocaine( 20 mg.) plus 1 cc. of kenalog-la ( 10 mg) plus 1/2 cc. of dexamethazone phosphate ( 2 mg)  RTC 2 weeks.   Gardiner Barefoot DPM

## 2019-04-27 LAB — BASIC METABOLIC PANEL WITH GFR
BUN: 11 mg/dL (ref 7–25)
CO2: 23 mmol/L (ref 20–32)
Calcium: 9.4 mg/dL (ref 8.6–10.3)
Chloride: 105 mmol/L (ref 98–110)
Creat: 0.98 mg/dL (ref 0.70–1.33)
GFR, Est African American: 102 mL/min/{1.73_m2} (ref >=60)
GFR, Est Non African American: 88 mL/min/{1.73_m2} (ref >=60)
Glucose, Bld: 117 mg/dL — ABNORMAL HIGH (ref 65–99)
Potassium: 4.5 mmol/L (ref 3.5–5.3)
Sodium: 140 mmol/L (ref 135–146)

## 2019-04-27 LAB — LIPID PANEL
Cholesterol: 246 mg/dL — ABNORMAL HIGH (ref <200)
HDL: 49 mg/dL (ref >=40)
LDL Cholesterol (Calc): 174 mg/dL (calc) — ABNORMAL HIGH
Non-HDL Cholesterol (Calc): 197 mg/dL (calc) — ABNORMAL HIGH (ref <130)
Total CHOL/HDL Ratio: 5 (calc) — ABNORMAL HIGH (ref <5.0)
Triglycerides: 105 mg/dL (ref <150)

## 2019-05-03 ENCOUNTER — Encounter: Payer: BC Managed Care – PPO | Admitting: Family Medicine

## 2019-05-03 ENCOUNTER — Encounter: Payer: BLUE CROSS/BLUE SHIELD | Admitting: Family Medicine

## 2019-05-06 ENCOUNTER — Ambulatory Visit (INDEPENDENT_AMBULATORY_CARE_PROVIDER_SITE_OTHER): Payer: Worker's Compensation | Admitting: Podiatry

## 2019-05-06 ENCOUNTER — Ambulatory Visit (INDEPENDENT_AMBULATORY_CARE_PROVIDER_SITE_OTHER): Payer: BC Managed Care – PPO | Admitting: Family Medicine

## 2019-05-06 ENCOUNTER — Encounter: Payer: Self-pay | Admitting: Podiatry

## 2019-05-06 ENCOUNTER — Other Ambulatory Visit: Payer: Self-pay

## 2019-05-06 ENCOUNTER — Encounter: Payer: Self-pay | Admitting: Family Medicine

## 2019-05-06 VITALS — BP 140/87 | HR 77 | Temp 98.5°F | Resp 16 | Ht 72.0 in | Wt 210.4 lb

## 2019-05-06 DIAGNOSIS — J321 Chronic frontal sinusitis: Secondary | ICD-10-CM | POA: Insufficient documentation

## 2019-05-06 DIAGNOSIS — E782 Mixed hyperlipidemia: Secondary | ICD-10-CM | POA: Diagnosis not present

## 2019-05-06 DIAGNOSIS — J309 Allergic rhinitis, unspecified: Secondary | ICD-10-CM | POA: Diagnosis not present

## 2019-05-06 DIAGNOSIS — I10 Essential (primary) hypertension: Secondary | ICD-10-CM | POA: Diagnosis not present

## 2019-05-06 DIAGNOSIS — M722 Plantar fascial fibromatosis: Secondary | ICD-10-CM

## 2019-05-06 DIAGNOSIS — J320 Chronic maxillary sinusitis: Secondary | ICD-10-CM

## 2019-05-06 MED ORDER — AMLODIPINE BESYLATE 5 MG PO TABS: 5.0000 mg | ORAL_TABLET | Freq: Every day | ORAL | 1 refills | Status: DC

## 2019-05-06 MED ORDER — MONTELUKAST SODIUM 10 MG PO TABS: 10.0000 mg | ORAL_TABLET | Freq: Every day | ORAL | 1 refills | Status: DC

## 2019-05-06 MED ORDER — IPRATROPIUM BROMIDE 0.06 % NA SOLN: 2.0000 | Freq: Four times a day (QID) | NASAL | 2 refills | Status: DC | PRN

## 2019-05-06 NOTE — Progress Notes (Signed)
This patient presents the office for diagnosis of plantar fasciitis right foot.  Patient was treated 2 weeks ago with injection therapy Mobic and power step insoles.  He states that the insoles have been very beneficial.  He presents the office today stating that he has 1 out of 10 pain.  He is very pleased with the treatment that was provided.  He presents the office today for continued evaluation and treatment.  Vascular  Dorsalis pedis and posterior tibial pulses are palpable  B/L.  Capillary return  WNL.  Temperature gradient is  WNL.  Skin turgor  WNL  Sensorium  Senn Weinstein monofilament wire  WNL. Normal tactile sensation.  Nail Exam  Patient has normal nails with no evidence of bacterial or fungal infection.  Orthopedic  Exam  Muscle tone and muscle strength  WNL.  No limitations of motion feet  B/L.  No crepitus or joint effusion noted.  Foot type is unremarkable and digits show no abnormalities.  Bony prominences are unremarkable. Minimal palpable pain at insertion plantar fascia right heel.  Skin  No open lesions.  Normal skin texture and turgor.  Plantar Fasciitis right foot  ROV.  Patient was told to continue taking the Mobic and wear the power step insoles.  Patient was told that he can consider orthotic therapy in the future if the orthoses are still beneficial.  He is to call the office for further evaluation and treatment.  Gardiner Barefoot DPM

## 2019-05-06 NOTE — Patient Instructions (Addendum)
Thank you for coming to the office today.  For sinuses  ADD Atrovent nasal spray decongestant 2 sprays in each nostril up to 4 times daily  Continue - nasal steroid Flonase 2 sprays in each nostril daily for 4-6 weeks, may repeat course seasonally or as needed - Can continue longer term.  Start Singulair 10mg  nightly every night for allergies  Can add Loratadine (Claritin), Cetirzine (Zyrtec) or Allegra once daily as needed OTC allergy pill.  I do recommend referral to ENT specialist if not improving.  Finish current antibiotics ,if not resolved we can consider a stronger antibiotic if need, but we try to avoid too many antibiotics at once.  Improving cholesterol, keep up the good work.   Please schedule a Follow-up Appointment to: Return in about 6 months (around 11/03/2019) for Annual Physical.  If you have any other questions or concerns, please feel free to call the office or send a message through [place]. You may also schedule an earlier appointment if necessary.  Additionally, you may be receiving a survey about your experience at our office within a few days to 1 week by e-mail or mail. We value your feedback.  Nobie Putnam, DO [place]

## 2019-05-06 NOTE — Progress Notes (Signed)
Subjective:    Patient ID: Timothy Lang, male    DOB: 1966-02-12, 53 y.o.   MRN: [id number]  Timothy Lang is a 53 y.o. male presenting on 05/06/2019 for Sinusitis   HPI    CHRONIC HTN Elevated BP today, stressed. Home readings 120-130s on avg. Last visit started on Amlodipine Current Meds -Amlodipine 5mg  daily Took dose today Lifestyle: - Diet:He has quit sodium completely. Otherwise improving diet healthier options. Rare soda. - Exercise: Outdoor walking, deer hunting.  HYPERLIPIDEMIA: Last lipid panel 04/2019, still elevated LDL >170, but improved TG Not on statin  Chronic Allergic Rhinosinusitis Reports sinus congestion, seems fairly persistent with some pressureheadache, nasal congestion, L >R - He was told by dentist - treating for R upper dental post work with fracture and infection until he can get to oral surgery - also told that his nasal cavity has congestion and blockage. He was placed on antibiotics Amoxicillin and Flagyl by dentist and advised to discuss with 05/2019. R side of sinus swollen. - He uses Flonase regularly with some benefit, but does not resolve his symptoms - He has not seen ENT _ he says that he was told years ago he would need sinus surgery but did not pursue it at the time.  Additionally  Improved plantar fasciitis, [facility]    Depression screen [facility] 2/9 03/23/2019 10/26/2018 09/25/2018  Decreased Interest 0 0 0  Down, Depressed, Hopeless 0 0 0  PHQ - 2 Score 0 0 0    History reviewed. No pertinent past medical history. Past Surgical History:  Procedure Laterality Date  . KNEE ARTHROSCOPY WITH MEDIAL MENISECTOMY Left 04/16/2017   Procedure: KNEE ARTHROSCOPY WITH PARTIAL MEDIAL MENISECTOMY, CHONDROPLASTY, PARTIAL SYNOVECTOMY;  Surgeon: 06/16/2017, MD;  Location: [facility] ORS;  Service: Orthopedics;  Laterality: Left;  . SURGERY OF LIP  2008  . TONSILLECTOMY  1974  . TOOTH EXTRACTION  2008  . vasectomy  1997   Social History    Socioeconomic History  . Marital status: Single    Spouse name: Not on file  . Number of children: Not on file  . Years of education: Not on file  . Highest education level: Not on file  Occupational History  . Not on file  Social Needs  . Financial resource strain: Not on file  . Food insecurity    Worry: Not on file    Inability: Not on file  . Transportation needs    Medical: Not on file    Non-medical: Not on file  Tobacco Use  . Smoking status: Former Smoker    Packs/day: 0.50    Types: Cigarettes    Quit date: 04/15/2016    Years since quitting: 3.0  . Smokeless tobacco: Former 06/15/2016 and Sexual Activity  . Alcohol use: No  . Drug use: No  . Sexual activity: Not on file  Lifestyle  . Physical activity    Days per week: Not on file    Minutes per session: Not on file  . Stress: Not on file  Relationships  . Social Engineer, water on phone: Not on file    Gets together: Not on file    Attends religious service: Not on file    Active member of club or organization: Not on file    Attends meetings of clubs or organizations: Not on file    Relationship status: Not on file  . Intimate partner violence    Fear of current  or ex partner: Not on file    Emotionally abused: Not on file    Physically abused: Not on file    Forced sexual activity: Not on file  Other Topics Concern  . Not on file  Social History Narrative  . Not on file   Family History  Problem Relation Age of Onset  . Breast cancer Mother    Current Outpatient Medications on File Prior to Visit  Medication Sig  . cyclobenzaprine (FLEXERIL) 10 MG tablet Take 0.5-1 tablets (5-10 mg total) by mouth 3 (three) times daily as needed for muscle spasms.  Marland Kitchen esomeprazole (NEXIUM) 20 MG packet Take 20 mg by mouth daily before breakfast.  . fluticasone (FLONASE) 50 MCG/ACT nasal spray Place 2 sprays into both nostrils daily. Use for 4-6 weeks then stop and use seasonally or as needed.  Marland Kitchen  ibuprofen (ADVIL) 800 MG tablet Take 800 mg by mouth every 8 (eight) hours as needed.  . meclizine (ANTIVERT) 25 MG tablet Take 1 tablet (25 mg total) by mouth 3 (three) times daily as needed for dizziness.  . meloxicam (MOBIC) 15 MG tablet Take 1 tablet (15 mg total) by mouth daily.  Marland Kitchen amoxicillin (AMOXIL) 500 MG tablet TK 1 T PO Q 8 H TAT  . metroNIDAZOLE (FLAGYL) 500 MG tablet TK 1 T PO Q 6 H   No current facility-administered medications on file prior to visit.     Review of Systems Per HPI unless specifically indicated above     Objective:    BP 140/87   Pulse 77   Temp 98.5 F (36.9 C) (Oral)   Resp 16   Ht 6' (1.829 m)   Wt 210 lb 6.4 oz (95.4 kg)   BMI 28.54 kg/m   Wt Readings from Last 3 Encounters:  05/06/19 210 lb 6.4 oz (95.4 kg)  03/23/19 216 lb (98 kg)  10/26/18 217 lb 9.6 oz (98.7 kg)    Physical Exam Vitals signs and nursing note reviewed.  Constitutional:      General: He is not in acute distress.    Appearance: He is well-developed. He is not diaphoretic.     Comments: Well-appearing, comfortable, cooperative  HENT:     Head: Normocephalic and atraumatic.     Comments: R maxillary sinus tender, R frontal sinus tender  Missing R upper tooth Eyes:     General:        Right eye: No discharge.        Left eye: No discharge.     Conjunctiva/sclera: Conjunctivae normal.  Neck:     Musculoskeletal: Normal range of motion and neck supple.     Thyroid: No thyromegaly.  Cardiovascular:     Rate and Rhythm: Normal rate and regular rhythm.     Heart sounds: Normal heart sounds. No murmur.  Pulmonary:     Effort: Pulmonary effort is normal. No respiratory distress.     Breath sounds: Normal breath sounds. No wheezing or rales.  Musculoskeletal: Normal range of motion.  Lymphadenopathy:     Cervical: No cervical adenopathy.  Skin:    General: Skin is warm and dry.     Findings: No erythema or rash.  Neurological:     Mental Status: He is alert and  oriented to person, place, and time.  Psychiatric:        Behavior: Behavior normal.     Comments: Well groomed, good eye contact, normal speech and thoughts      Results for orders  placed or performed in visit on 04/26/19  Lipid panel  Result Value Ref Range   Cholesterol 246 (H) <200 mg/dL   HDL 49 > OR = 40 mg/dL   Triglycerides 161105 <096<150 mg/dL   LDL Cholesterol (Calc) 174 (H) mg/dL (calc)   Total CHOL/HDL Ratio 5.0 (H) <5.0 (calc)   Non-HDL Cholesterol (Calc) 197 (H) <130 mg/dL (calc)  BASIC METABOLIC PANEL WITH GFR  Result Value Ref Range   Glucose, Bld 117 (H) 65 - 99 mg/dL   BUN 11 7 - 25 mg/dL   Creat 0.450.98 4.090.70 - 8.111.33 mg/dL   GFR, Est Non African American 88 > OR = 60 mL/min/1.8673m2   GFR, Est African American 102 > OR = 60 mL/min/1.5873m2   BUN/Creatinine Ratio NOT APPLICABLE 6 - 22 (calc)   Sodium 140 135 - 146 mmol/L   Potassium 4.5 3.5 - 5.3 mmol/L   Chloride 105 98 - 110 mmol/L   CO2 23 20 - 32 mmol/L   Calcium 9.4 8.6 - 10.3 mg/dL      Assessment & Plan:   Problem List Items Addressed This Visit    Chronic allergic rhinitis   Chronic frontal sinusitis - Primary   Relevant Medications   amoxicillin (AMOXIL) 500 MG tablet   metroNIDAZOLE (FLAGYL) 500 MG tablet   ipratropium (ATROVENT) 0.06 % nasal spray   montelukast (SINGULAIR) 10 MG tablet  Clinically chronic sinusitis No acute infection Already on amox and flagyl per dentist  Continue flonase Add atrovent regularly PRN for now Add Singulair Start OTC anti histamine Offered refer to ENT given chronicity of problem - however he declines today will try these increased meds and if still not improve notify us - I advised him basedon his history and x-rays by report sounds like he may have more anatomical limitations with sinuses    Essential hypertension Elevated BP, repeat Home readings reviewed Possibly with sinus pain dental pain  For now monitor home readings, repeat eval at next visit consider dose  increase amlodipine if still unresolved however concern given he has had normal home BP and history of vertigo, he can bring cuff in to calibrate if need    Relevant Medications   amLODipine (NORVASC) 5 MG tablet   Mixed hyperlipidemia  Encourage continued lifestyle improvement wt loss Defer statin therapy at this time given improved profile so far and wt loss Recalculate ASCVD risk in future    Relevant Medications   amLODipine (NORVASC) 5 MG tablet      Meds ordered this encounter  Medications  . amLODipine (NORVASC) 5 MG tablet    Sig: Take 1 tablet (5 mg total) by mouth daily.    Dispense:  90 tablet    Refill:  1  . ipratropium (ATROVENT) 0.06 % nasal spray    Sig: Place 2 sprays into both nostrils 4 (four) times daily as needed for rhinitis.    Dispense:  15 mL    Refill:  2  . montelukast (SINGULAIR) 10 MG tablet    Sig: Take 1 tablet (10 mg total) by mouth at bedtime.    Dispense:  90 tablet    Refill:  1     Follow up plan: Return in about 6 months (around 11/03/2019) for Annual Physical.  Saralyn PilarAlexander Karamalegos, DO [facility] 05/06/2019, 9:21 AM

## 2019-05-10 ENCOUNTER — Ambulatory Visit: Payer: Self-pay | Admitting: Podiatry

## 2019-06-04 ENCOUNTER — Other Ambulatory Visit: Payer: Self-pay | Admitting: Family Medicine

## 2019-06-04 DIAGNOSIS — J320 Chronic maxillary sinusitis: Secondary | ICD-10-CM

## 2019-07-06 ENCOUNTER — Telehealth: Payer: Self-pay | Admitting: Podiatry

## 2019-07-06 NOTE — Telephone Encounter (Signed)
Pt called and is now under workman's comp and has contacted his case manager for [facility] Johnette Abraham) and they have approved pt for orthotics.  I explained that we would need something in writing of the approval. She emailed me a letter stating it is approved.  Ria Comment [phone]) is pts case Freight forwarder.

## 2019-07-28 ENCOUNTER — Ambulatory Visit: Payer: BC Managed Care – PPO | Admitting: Orthotics

## 2019-07-28 ENCOUNTER — Other Ambulatory Visit: Payer: Self-pay

## 2019-07-28 DIAGNOSIS — M722 Plantar fascial fibromatosis: Secondary | ICD-10-CM

## 2019-07-28 NOTE — Progress Notes (Signed)

## 2019-07-30 DIAGNOSIS — L720 Epidermal cyst: Secondary | ICD-10-CM | POA: Diagnosis not present

## 2019-08-11 DIAGNOSIS — L720 Epidermal cyst: Secondary | ICD-10-CM | POA: Diagnosis not present

## 2019-08-11 DIAGNOSIS — L820 Inflamed seborrheic keratosis: Secondary | ICD-10-CM | POA: Diagnosis not present

## 2019-08-17 DIAGNOSIS — Z4802 Encounter for removal of sutures: Secondary | ICD-10-CM | POA: Diagnosis not present

## 2019-08-17 DIAGNOSIS — L720 Epidermal cyst: Secondary | ICD-10-CM | POA: Diagnosis not present

## 2019-08-18 ENCOUNTER — Ambulatory Visit: Payer: BC Managed Care – PPO | Admitting: Orthotics

## 2019-08-18 ENCOUNTER — Other Ambulatory Visit: Payer: Self-pay

## 2019-08-18 DIAGNOSIS — M722 Plantar fascial fibromatosis: Secondary | ICD-10-CM

## 2019-08-18 DIAGNOSIS — J309 Allergic rhinitis, unspecified: Secondary | ICD-10-CM

## 2019-08-18 DIAGNOSIS — J321 Chronic frontal sinusitis: Secondary | ICD-10-CM

## 2019-08-18 NOTE — Progress Notes (Signed)
Patient came in today to pick up custom made foot orthotics.  The goals were accomplished and the patient reported no dissatisfaction with said orthotics.  Patient was advised of breakin period and how to report any issues. 

## 2019-08-23 ENCOUNTER — Other Ambulatory Visit: Payer: Self-pay | Admitting: Podiatry

## 2019-09-23 ENCOUNTER — Other Ambulatory Visit: Payer: Self-pay | Admitting: Podiatry

## 2019-10-24 ENCOUNTER — Other Ambulatory Visit: Payer: Self-pay | Admitting: Podiatry

## 2019-10-29 ENCOUNTER — Other Ambulatory Visit: Payer: Self-pay | Admitting: Family Medicine

## 2019-10-29 DIAGNOSIS — I10 Essential (primary) hypertension: Secondary | ICD-10-CM

## 2019-10-29 DIAGNOSIS — J320 Chronic maxillary sinusitis: Secondary | ICD-10-CM

## 2019-11-05 ENCOUNTER — Telehealth: Payer: Self-pay | Admitting: Family Medicine

## 2019-11-05 DIAGNOSIS — Z0000 Encounter for general adult medical examination without abnormal findings: Secondary | ICD-10-CM

## 2019-11-08 ENCOUNTER — Other Ambulatory Visit: Payer: Self-pay

## 2019-11-08 ENCOUNTER — Other Ambulatory Visit: Payer: BC Managed Care – PPO

## 2019-11-08 DIAGNOSIS — Z0000 Encounter for general adult medical examination without abnormal findings: Secondary | ICD-10-CM | POA: Diagnosis not present

## 2019-11-09 LAB — COMPLETE METABOLIC PANEL WITH GFR
AG Ratio: 1.6 (calc) (ref 1.0–2.5)
ALT: 56 U/L — ABNORMAL HIGH (ref 9–46)
AST: 30 U/L (ref 10–35)
Albumin: 4.2 g/dL (ref 3.6–5.1)
Alkaline phosphatase (APISO): 95 U/L (ref 35–144)
BUN: 18 mg/dL (ref 7–25)
CO2: 27 mmol/L (ref 20–32)
Calcium: 9.5 mg/dL (ref 8.6–10.3)
Chloride: 105 mmol/L (ref 98–110)
Creat: 0.87 mg/dL (ref 0.70–1.33)
GFR, Est African American: 113 mL/min/{1.73_m2} (ref >=60)
GFR, Est Non African American: 98 mL/min/{1.73_m2} (ref >=60)
Globulin: 2.6 g/dL (calc) (ref 1.9–3.7)
Glucose, Bld: 123 mg/dL — ABNORMAL HIGH (ref 65–99)
Potassium: 4.3 mmol/L (ref 3.5–5.3)
Sodium: 139 mmol/L (ref 135–146)
Total Bilirubin: 0.5 mg/dL (ref 0.2–1.2)
Total Protein: 6.8 g/dL (ref 6.1–8.1)

## 2019-11-09 LAB — CBC WITH DIFFERENTIAL/PLATELET
Absolute Monocytes: 580 cells/uL (ref 200–950)
Basophils Absolute: 70 cells/uL (ref 0–200)
Basophils Relative: 1.2 %
Eosinophils Absolute: 232 cells/uL (ref 15–500)
Eosinophils Relative: 4 %
HCT: 44 % (ref 38.5–50.0)
Hemoglobin: 15.1 g/dL (ref 13.2–17.1)
Lymphs Abs: 2616 cells/uL (ref 850–3900)
MCH: 30.8 pg (ref 27.0–33.0)
MCHC: 34.3 g/dL (ref 32.0–36.0)
MCV: 89.8 fL (ref 80.0–100.0)
MPV: 9.9 fL (ref 7.5–12.5)
Monocytes Relative: 10 %
Neutro Abs: 2303 cells/uL (ref 1500–7800)
Neutrophils Relative %: 39.7 %
Platelets: 290 10*3/uL (ref 140–400)
RBC: 4.9 10*6/uL (ref 4.20–5.80)
RDW: 12.6 % (ref 11.0–15.0)
Total Lymphocyte: 45.1 %
WBC: 5.8 10*3/uL (ref 3.8–10.8)

## 2019-11-09 LAB — LIPID PANEL
Cholesterol: 237 mg/dL — ABNORMAL HIGH (ref <200)
HDL: 40 mg/dL (ref >=40)
LDL Cholesterol (Calc): 165 mg/dL (calc) — ABNORMAL HIGH
Non-HDL Cholesterol (Calc): 197 mg/dL (calc) — ABNORMAL HIGH (ref <130)
Total CHOL/HDL Ratio: 5.9 (calc) — ABNORMAL HIGH (ref <5.0)
Triglycerides: 170 mg/dL — ABNORMAL HIGH (ref <150)

## 2019-11-09 LAB — HEMOGLOBIN A1C
Hgb A1c MFr Bld: 5.2 % of total Hgb (ref <5.7)
Mean Plasma Glucose: 103 (calc)
eAG (mmol/L): 5.7 (calc)

## 2019-11-09 NOTE — Telephone Encounter (Signed)
Lab ordered.

## 2019-11-15 ENCOUNTER — Other Ambulatory Visit: Payer: Self-pay | Admitting: Family Medicine

## 2019-11-15 ENCOUNTER — Encounter: Payer: Self-pay | Admitting: Family Medicine

## 2019-11-15 ENCOUNTER — Other Ambulatory Visit: Payer: Self-pay

## 2019-11-15 ENCOUNTER — Ambulatory Visit (INDEPENDENT_AMBULATORY_CARE_PROVIDER_SITE_OTHER): Payer: BC Managed Care – PPO | Admitting: Family Medicine

## 2019-11-15 VITALS — BP 128/88 | HR 68 | Temp 98.4°F | Resp 16 | Ht 72.0 in | Wt 226.6 lb

## 2019-11-15 DIAGNOSIS — E669 Obesity, unspecified: Secondary | ICD-10-CM | POA: Diagnosis not present

## 2019-11-15 DIAGNOSIS — Z0000 Encounter for general adult medical examination without abnormal findings: Secondary | ICD-10-CM

## 2019-11-15 DIAGNOSIS — E782 Mixed hyperlipidemia: Secondary | ICD-10-CM | POA: Diagnosis not present

## 2019-11-15 DIAGNOSIS — I10 Essential (primary) hypertension: Secondary | ICD-10-CM | POA: Diagnosis not present

## 2019-11-15 MED ORDER — AMLODIPINE BESYLATE 10 MG PO TABS: 10.0000 mg | ORAL_TABLET | Freq: Every day | ORAL | 3 refills | Status: DC

## 2019-11-15 MED ORDER — ROSUVASTATIN CALCIUM 10 MG PO TABS: 10.0000 mg | ORAL_TABLET | Freq: Every day | ORAL | 3 refills | Status: DC

## 2019-11-15 NOTE — Assessment & Plan Note (Addendum)
Improved, but still abnormal cholesterol elevated LDL, gradual improving lifestyle Last lipid panel 10/2019 Calculated ASCVD 10 yr risk score >9% as HTN now  Plan: 1. NEW START Statin Rosuvastatin 10mg  nightly - counsel on benefit risk and potential side effect dosing 2. Encourage improved lifestyle - low carb/cholesterol, reduce portion size, continue improving regular exercise - he is still on ASA 81mg  daily, has not changed. Discussed risk/benefit, it is not bothering him now, he can continue both ASA and Statin for now, in future age 42-70+ can consider stopping ASA  Follow-up 6 months labs CMET / Lipid

## 2019-11-15 NOTE — Patient Instructions (Addendum)
Thank you for coming to the office today.  Increase Amlodipine from 5 to 10mg  - can take TWO of the existing 5mg  tabs, or I sent new rx Amlodipine 10 to pharmacy.  You are at increased risk of future Cardiovascular complications such as Heart Attack or Stroke from an artery blockage due to abnormal cholesterol and/or risk factors. - As discussed, Statin Cholesterol pills both can both LOWER cholesterol and REDUCE this future risk of heart attack and stroke - Start Rosuvastatin (generic Crestor) 10mg  pill once at bedtime every night  If you develop mild aches or pains in muscle or joint that does NOT improve or go away after first 3-4 weeks then this may require to adjust the dose. First I would recommend STOPPING the medication for a few weeks until your ache and pain symptoms completely RESOLVE. Then you can restart at a LOWER DOSE either HALF a pill at bedtime every night or LESS OFTEN such as one pill a week only and then gradually increase to every other day or max dose of 3 times a week  Lastly, sometimes we need to try other versions of this medicine to find one that works for you and does not cause side effects.    DUE for FASTING BLOOD WORK (no food or drink after midnight before the lab appointment, only water or coffee without cream/sugar on the morning of)  SCHEDULE "Lab Only" visit in the morning at the clinic for lab draw in 6 MONTHS   - Make sure Lab Only appointment is at about 1 week before your next appointment, so that results will be available  For Lab Results, once available within 2-3 days of blood draw, you can can log in to MyChart online to view your results and a brief explanation. Also, we can discuss results at next follow-up visit.   Please schedule a Follow-up Appointment to: Return in about 6 months (around 05/16/2020) for 6 month fasting lab only then 1 week later Follow-up HTN, HLD lab results.  If you have any other questions or concerns, please feel free  to call the office or send a message through MyChart. You may also schedule an earlier appointment if necessary.  Additionally, you may be receiving a survey about your experience at our office within a few days to 1 week by e-mail or mail. We value your feedback.  , DO [facility], 07/16/2020

## 2019-11-15 NOTE — Assessment & Plan Note (Signed)
Mildly elevated initial DBP, however he has home readings elevated DBP >90 as well Last visit in past 6 months newly started on HTN therapy Amlodipine, seems partially effective No known complications    Plan:  1. INCREASE dose Amlodipine from 5 to 10mg  - new rx sent, double existing med, can consider 2nd agent if need in future 2. Encourage improved lifestyle - low sodium diet, regular exercise 3. Continue monitor BP outside office, bring readings to next visit, if persistently >140/90 or new symptoms notify office sooner  6 mo f/u

## 2019-11-15 NOTE — Assessment & Plan Note (Signed)
Some weight gain But episodic lifestyle intervention diet/exercise, reviewed today

## 2019-11-15 NOTE — Progress Notes (Signed)
Subjective:    Patient ID: Timothy Lang, male    DOB: [birth year], 54 y.o.   MRN: [id number]  Timothy Lang is a 54 y.o. male presenting on 11/15/2019 for Annual Exam   HPI   Here for Annual Physical and Lab Review. 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  CHRONIC HTN: Reports history of elevated BP, usually DBP >90s. Previously recent dx HTN, he was started on Amlodipine 5mg  daily. Home BP cuff has had average DBP >90 still on med. Current Meds - Amlodipine 5mg  daily Lifestyle: - Diet: episodic, some higher salt content can raise it. Tries to avoid salt - Exercise: episodic, seasonal will be more active at times Admits very rare dizziness episode - prior BPPV improved Denies CP, dyspnea, HA, edema, dizziness  HYPERLIPIDEMIA / Obesity BMI >30 - Reports concerns. Last lipid panel 10/2019 showed improvement Total Cholesterol 230s and LDL into 160s, previously TC >250 and LDL >170 Not on statin therapy   S/p Knee Surgery, Left - history of torn meniscus  History SKs - removed by [facility].   Health Maintenance: Cologuard - negative 01/06/19. Next due 12/2021  Due for COVID19 vaccine.  Depression screen [facility] 2/9 11/15/2019 03/23/2019 10/26/2018  Decreased Interest 0 0 0  Down, Depressed, Hopeless 0 0 0  PHQ - 2 Score 0 0 0    History reviewed. No pertinent past medical history. Past Surgical History:  Procedure Laterality Date  . KNEE ARTHROSCOPY WITH MEDIAL MENISECTOMY Left 04/16/2017   Procedure: KNEE ARTHROSCOPY WITH PARTIAL MEDIAL MENISECTOMY, CHONDROPLASTY, PARTIAL SYNOVECTOMY;  Surgeon: 10/28/2018, MD;  Location: [facility] ORS;  Service: Orthopedics;  Laterality: Left;  . SURGERY OF LIP  2008  . TONSILLECTOMY  1974  . TOOTH EXTRACTION  2008  . vasectomy  1997   Social History   Socioeconomic History  . Marital status: Single    Spouse name: Not on file  . Number of children: Not on file  . Years of education: Not on file  . Highest  education level: Not on file  Occupational History  . Not on file  Tobacco Use  . Smoking status: Former Smoker    Packs/day: 0.50    Types: Cigarettes    Quit date: 04/15/2016    Years since quitting: 3.5  . Smokeless tobacco: Former 1998 and Sexual Activity  . Alcohol use: No  . Drug use: No  . Sexual activity: Not on file  Other Topics Concern  . Not on file  Social History Narrative  . Not on file   Social Determinants of Health   Financial Resource Strain:   . Difficulty of Paying Living Expenses:   Food Insecurity:   . Worried About 06/15/2016 in the Last Year:   . Engineer, water in the Last Year:   Transportation Needs:   . Programme researcher, broadcasting/film/video (Medical):   Barista Lack of Transportation (Non-Medical):   Physical Activity:   . Days of Exercise per Week:   . Minutes of Exercise per Session:   Stress:   . Feeling of Stress :   Social Connections:   . Frequency of Communication with Friends and Family:   . Frequency of Social Gatherings with Friends and Family:   . Attends Religious Services:   . Active Member of Clubs or Organizations:   . Attends Freight forwarder Meetings:   Marland Kitchen Marital Status:   Intimate Partner Violence:   .  Fear of Current or Ex-Partner:   . Emotionally Abused:   Marland Kitchen Physically Abused:   . Sexually Abused:    Family History  Problem Relation Age of Onset  . Breast cancer Mother    Current Outpatient Medications on File Prior to Visit  Medication Sig  . ASPIRIN 81 PO Take 81 mg by mouth daily.  Marland Kitchen esomeprazole (NEXIUM) 20 MG packet Take 20 mg by mouth daily before breakfast.  . fluticasone (FLONASE) 50 MCG/ACT nasal spray Place 2 sprays into both nostrils daily. Use for 4-6 weeks then stop and use seasonally or as needed.  Marland Kitchen ibuprofen (ADVIL) 800 MG tablet Take 800 mg by mouth every 8 (eight) hours as needed.  Marland Kitchen ipratropium (ATROVENT) 0.06 % nasal spray USE 2 SPRAYS IN EACH NOSTRIL FOUR TIMES DAILY AS NEEDED FOR RHINITIS    . meclizine (ANTIVERT) 25 MG tablet Take 1 tablet (25 mg total) by mouth 3 (three) times daily as needed for dizziness.  . meloxicam (MOBIC) 15 MG tablet TAKE 1 TABLET(15 MG) BY MOUTH DAILY  . montelukast (SINGULAIR) 10 MG tablet TAKE 1 TABLET(10 MG) BY MOUTH AT BEDTIME   No current facility-administered medications on file prior to visit.    Review of Systems  Constitutional: Negative for activity change, appetite change, chills, diaphoresis, fatigue and fever.  HENT: Negative for congestion and hearing loss.   Eyes: Negative for visual disturbance.  Respiratory: Negative for apnea, cough, chest tightness, shortness of breath and wheezing.   Cardiovascular: Negative for chest pain, palpitations and leg swelling.  Gastrointestinal: Negative for abdominal pain, anal bleeding, blood in stool, constipation, diarrhea, nausea and vomiting.  Endocrine: Negative for cold intolerance.  Genitourinary: Negative for difficulty urinating, dysuria, frequency and hematuria.  Musculoskeletal: Negative for arthralgias, back pain and neck pain.  Skin: Negative for rash.  Allergic/Immunologic: Negative for environmental allergies.  Neurological: Negative for dizziness, weakness, light-headedness, numbness and headaches.  Hematological: Negative for adenopathy.  Psychiatric/Behavioral: Negative for behavioral problems, dysphoric mood and sleep disturbance. The patient is not nervous/anxious.    Per HPI unless specifically indicated above     Objective:    BP 128/88 (BP Location: Left Arm, Cuff Size: Normal)   Pulse 68   Temp 98.4 F (36.9 C) (Temporal)   Resp 16   Ht 6' (1.829 m)   Wt 226 lb 9.6 oz (102.8 kg)   BMI 30.73 kg/m   Wt Readings from Last 3 Encounters:  11/15/19 226 lb 9.6 oz (102.8 kg)  05/06/19 210 lb 6.4 oz (95.4 kg)  03/23/19 216 lb (98 kg)    Physical Exam Vitals and nursing note reviewed.  Constitutional:      General: He is not in acute distress.    Appearance: He is  well-developed. He is not diaphoretic.     Comments: Well-appearing, comfortable, cooperative  HENT:     Head: Normocephalic and atraumatic.  Eyes:     General:        Right eye: No discharge.        Left eye: No discharge.     Conjunctiva/sclera: Conjunctivae normal.     Pupils: Pupils are equal, round, and reactive to light.  Neck:     Thyroid: No thyromegaly.  Cardiovascular:     Rate and Rhythm: Normal rate and regular rhythm.     Heart sounds: Normal heart sounds. No murmur.  Pulmonary:     Effort: Pulmonary effort is normal. No respiratory distress.     Breath sounds: Normal breath  sounds. No wheezing or rales.  Abdominal:     General: Bowel sounds are normal. There is no distension.     Palpations: Abdomen is soft. There is no mass.     Tenderness: There is no abdominal tenderness.  Musculoskeletal:        General: No tenderness. Normal range of motion.     Cervical back: Normal range of motion and neck supple.     Comments: Upper / Lower Extremities: - Normal muscle tone, strength bilateral upper extremities 5/5, lower extremities 5/5  Lymphadenopathy:     Cervical: No cervical adenopathy.  Skin:    General: Skin is warm and dry.     Findings: No erythema or rash.  Neurological:     Mental Status: He is alert and oriented to person, place, and time.     Comments: Distal sensation intact to light touch all extremities  Psychiatric:        Behavior: Behavior normal.     Comments: Well groomed, good eye contact, normal speech and thoughts       Results for orders placed or performed in visit on 11/05/19  COMPLETE METABOLIC PANEL WITH GFR  Result Value Ref Range   Glucose, Bld 123 (H) 65 - 99 mg/dL   BUN 18 7 - 25 mg/dL   Creat 0.94 7.09 - 6.28 mg/dL   GFR, Est Non African American 98 > OR = 60 mL/min/1.60m2   GFR, Est African American 113 > OR = 60 mL/min/1.19m2   BUN/Creatinine Ratio NOT APPLICABLE 6 - 22 (calc)   Sodium 139 135 - 146 mmol/L   Potassium 4.3  3.5 - 5.3 mmol/L   Chloride 105 98 - 110 mmol/L   CO2 27 20 - 32 mmol/L   Calcium 9.5 8.6 - 10.3 mg/dL   Total Protein 6.8 6.1 - 8.1 g/dL   Albumin 4.2 3.6 - 5.1 g/dL   Globulin 2.6 1.9 - 3.7 g/dL (calc)   AG Ratio 1.6 1.0 - 2.5 (calc)   Total Bilirubin 0.5 0.2 - 1.2 mg/dL   Alkaline phosphatase (APISO) 95 35 - 144 U/L   AST 30 10 - 35 U/L   ALT 56 (H) 9 - 46 U/L  CBC with Differential/Platelet  Result Value Ref Range   WBC 5.8 3.8 - 10.8 Thousand/uL   RBC 4.90 4.20 - 5.80 Million/uL   Hemoglobin 15.1 13.2 - 17.1 g/dL   HCT 36.6 29.4 - 76.5 %   MCV 89.8 80.0 - 100.0 fL   MCH 30.8 27.0 - 33.0 pg   MCHC 34.3 32.0 - 36.0 g/dL   RDW 46.5 03.5 - 46.5 %   Platelets 290 140 - 400 Thousand/uL   MPV 9.9 7.5 - 12.5 fL   Neutro Abs 2,303 1,500 - 7,800 cells/uL   Lymphs Abs 2,616 850 - 3,900 cells/uL   Absolute Monocytes 580 200 - 950 cells/uL   Eosinophils Absolute 232 15 - 500 cells/uL   Basophils Absolute 70 0 - 200 cells/uL   Neutrophils Relative % 39.7 %   Total Lymphocyte 45.1 %   Monocytes Relative 10.0 %   Eosinophils Relative 4.0 %   Basophils Relative 1.2 %  Lipid panel  Result Value Ref Range   Cholesterol 237 (H) <200 mg/dL   HDL 40 > OR = 40 mg/dL   Triglycerides 681 (H) <150 mg/dL   LDL Cholesterol (Calc) 165 (H) mg/dL (calc)   Total CHOL/HDL Ratio 5.9 (H) <5.0 (calc)   Non-HDL Cholesterol (Calc) 197 (H) <  130 mg/dL (calc)  Hemoglobin A1c  Result Value Ref Range   Hgb A1c MFr Bld 5.2 <5.7 % of total Hgb   Mean Plasma Glucose 103 (calc)   eAG (mmol/L) 5.7 (calc)      Assessment & Plan:   Problem List Items Addressed This Visit    Obesity (BMI 30.0-34.9)    Some weight gain But episodic lifestyle intervention diet/exercise, reviewed today      Mixed hyperlipidemia    Improved, but still abnormal cholesterol elevated LDL, gradual improving lifestyle Last lipid panel 10/2019 Calculated ASCVD 10 yr risk score >9% as HTN now  Plan: 1. NEW START Statin  Rosuvastatin 10mg  nightly - counsel on benefit risk and potential side effect dosing 2. Encourage improved lifestyle - low carb/cholesterol, reduce portion size, continue improving regular exercise - he is still on ASA 81mg  daily, has not changed. Discussed risk/benefit, it is not bothering him now, he can continue both ASA and Statin for now, in future age 17-70+ can consider stopping ASA  Follow-up 6 months labs CMET / Lipid      Relevant Medications   ASPIRIN 81 PO   amLODipine (NORVASC) 10 MG tablet   rosuvastatin (CRESTOR) 10 MG tablet   Essential hypertension    Mildly elevated initial DBP, however he has home readings elevated DBP >90 as well Last visit in past 6 months newly started on HTN therapy Amlodipine, seems partially effective No known complications    Plan:  1. INCREASE dose Amlodipine from 5 to 10mg  - new rx sent, double existing med, can consider 2nd agent if need in future 2. Encourage improved lifestyle - low sodium diet, regular exercise 3. Continue monitor BP outside office, bring readings to next visit, if persistently >140/90 or new symptoms notify office sooner  6 mo f/u      Relevant Medications   ASPIRIN 81 PO   amLODipine (NORVASC) 10 MG tablet   rosuvastatin (CRESTOR) 10 MG tablet    Other Visit Diagnoses    Annual physical exam    -  Primary      Updated Health Maintenance information - UTD Cologuard negative 12/2018, next due 12/2021 - Recommend COVID19 vaccine series Reviewed recent lab results with patient - See above A&P, elevated HLD - indicated for statin. Encouraged improvement to lifestyle with diet and exercise - Goal of weight loss   Meds ordered this encounter  Medications  . amLODipine (NORVASC) 10 MG tablet    Sig: Take 1 tablet (10 mg total) by mouth at bedtime.    Dispense:  90 tablet    Refill:  3    Dose increase from 5 to 10mg  - please discontinue any other existing 5mg  rx on file. Patient may pick up when ready.  .  rosuvastatin (CRESTOR) 10 MG tablet    Sig: Take 1 tablet (10 mg total) by mouth at bedtime.    Dispense:  90 tablet    Refill:  3     Follow up plan: Return in about 6 months (around 05/16/2020) for 6 month fasting lab only then 1 week later Follow-up HTN, HLD lab results.   FUture labs CMET Lipid 6 mo (05/2020)  Nobie Putnam, [facility] 11/15/2019, 9:43 AM

## 2019-12-01 ENCOUNTER — Other Ambulatory Visit: Payer: Self-pay | Admitting: Podiatry

## 2019-12-30 ENCOUNTER — Other Ambulatory Visit: Payer: Self-pay | Admitting: Podiatry

## 2019-12-30 NOTE — Telephone Encounter (Signed)
Please refill.

## 2019-12-30 NOTE — Telephone Encounter (Signed)
Only refill once.  He needs an appointment.

## 2019-12-30 NOTE — Telephone Encounter (Signed)
Refill this med? 

## 2020-01-05 ENCOUNTER — Telehealth: Payer: Self-pay | Admitting: *Deleted

## 2020-01-05 MED ORDER — MELOXICAM 15 MG PO TABS: ORAL_TABLET | ORAL | 0 refills | Status: DC

## 2020-02-03 ENCOUNTER — Other Ambulatory Visit: Payer: Self-pay | Admitting: Podiatry

## 2020-04-28 ENCOUNTER — Other Ambulatory Visit: Payer: Self-pay | Admitting: Family Medicine

## 2020-04-28 DIAGNOSIS — J320 Chronic maxillary sinusitis: Secondary | ICD-10-CM

## 2020-04-28 DIAGNOSIS — I10 Essential (primary) hypertension: Secondary | ICD-10-CM

## 2020-05-12 ENCOUNTER — Other Ambulatory Visit: Payer: Self-pay | Admitting: Family Medicine

## 2020-05-12 DIAGNOSIS — E782 Mixed hyperlipidemia: Secondary | ICD-10-CM

## 2020-05-12 DIAGNOSIS — I10 Essential (primary) hypertension: Secondary | ICD-10-CM

## 2020-05-15 ENCOUNTER — Other Ambulatory Visit: Payer: BC Managed Care – PPO

## 2020-05-15 DIAGNOSIS — E782 Mixed hyperlipidemia: Secondary | ICD-10-CM | POA: Diagnosis not present

## 2020-05-15 DIAGNOSIS — I10 Essential (primary) hypertension: Secondary | ICD-10-CM | POA: Diagnosis not present

## 2020-05-15 LAB — COMPLETE METABOLIC PANEL WITH GFR
AG Ratio: 1.9 (calc) (ref 1.0–2.5)
ALT: 46 U/L (ref 9–46)
AST: 34 U/L (ref 10–35)
Albumin: 4.6 g/dL (ref 3.6–5.1)
Alkaline phosphatase (APISO): 147 U/L — ABNORMAL HIGH (ref 35–144)
BUN: 13 mg/dL (ref 7–25)
CO2: 23 mmol/L (ref 20–32)
Calcium: 9.1 mg/dL (ref 8.6–10.3)
Chloride: 106 mmol/L (ref 98–110)
Creat: 0.88 mg/dL (ref 0.70–1.33)
GFR, Est African American: 113 mL/min/{1.73_m2} (ref >=60)
GFR, Est Non African American: 97 mL/min/{1.73_m2} (ref >=60)
Globulin: 2.4 g/dL (calc) (ref 1.9–3.7)
Glucose, Bld: 106 mg/dL — ABNORMAL HIGH (ref 65–99)
Potassium: 4.2 mmol/L (ref 3.5–5.3)
Sodium: 139 mmol/L (ref 135–146)
Total Bilirubin: 0.4 mg/dL (ref 0.2–1.2)
Total Protein: 7 g/dL (ref 6.1–8.1)

## 2020-05-15 LAB — LIPID PANEL
Cholesterol: 134 mg/dL (ref <200)
HDL: 46 mg/dL (ref >=40)
LDL Cholesterol (Calc): 70 mg/dL (calc)
Non-HDL Cholesterol (Calc): 88 mg/dL (calc) (ref <130)
Total CHOL/HDL Ratio: 2.9 (calc) (ref <5.0)
Triglycerides: 94 mg/dL (ref <150)

## 2020-05-17 ENCOUNTER — Other Ambulatory Visit: Payer: Self-pay

## 2020-05-19 ENCOUNTER — Ambulatory Visit: Payer: BC Managed Care – PPO | Admitting: Family Medicine

## 2020-05-23 ENCOUNTER — Other Ambulatory Visit: Payer: Self-pay | Admitting: Family Medicine

## 2020-05-23 ENCOUNTER — Other Ambulatory Visit: Payer: Self-pay

## 2020-05-23 ENCOUNTER — Ambulatory Visit (INDEPENDENT_AMBULATORY_CARE_PROVIDER_SITE_OTHER): Payer: BC Managed Care – PPO | Admitting: Family Medicine

## 2020-05-23 ENCOUNTER — Encounter: Payer: Self-pay | Admitting: Family Medicine

## 2020-05-23 VITALS — BP 122/75 | HR 63 | Temp 98.2°F | Resp 16 | Ht 72.0 in | Wt 225.0 lb

## 2020-05-23 DIAGNOSIS — E669 Obesity, unspecified: Secondary | ICD-10-CM | POA: Diagnosis not present

## 2020-05-23 DIAGNOSIS — R7309 Other abnormal glucose: Secondary | ICD-10-CM

## 2020-05-23 DIAGNOSIS — Z0000 Encounter for general adult medical examination without abnormal findings: Secondary | ICD-10-CM

## 2020-05-23 DIAGNOSIS — I10 Essential (primary) hypertension: Secondary | ICD-10-CM

## 2020-05-23 DIAGNOSIS — E782 Mixed hyperlipidemia: Secondary | ICD-10-CM | POA: Diagnosis not present

## 2020-05-23 DIAGNOSIS — N529 Male erectile dysfunction, unspecified: Secondary | ICD-10-CM

## 2020-05-23 DIAGNOSIS — Z125 Encounter for screening for malignant neoplasm of prostate: Secondary | ICD-10-CM

## 2020-05-23 MED ORDER — SILDENAFIL CITRATE 20 MG PO TABS: ORAL_TABLET | ORAL | 2 refills | Status: DC

## 2020-05-23 NOTE — Assessment & Plan Note (Signed)
Dramatic improved cholesterol control on statin now Last lipid panel 05/2020, LDL 160 to 70 Calculated ASCVD 10 yr risk score >9% as HTN now  Plan: 1. CONTINUE Rosuvastatin 10mg  nightly 2. Encourage improved lifestyle - low carb/cholesterol, reduce portion size, continue improving regular exercise - he is still on ASA 81mg  daily, has not changed. Discussed risk/benefit, it is not bothering him now, he can continue both ASA and Statin for now, in future age 11-70+ can consider stopping ASA

## 2020-05-23 NOTE — Progress Notes (Signed)
Subjective:    Patient ID: Timothy Lang, male    DOB: 1965/11/27, 54 y.o.   MRN: [id number]  Timothy Lang is a 54 y.o. male presenting on 05/23/2020 for Hypertension   HPI   CHRONIC HTN: He is doing well. Home BP readings reviewed Current Meds - Amlodipine 10mg  daily Lifestyle: - Diet: episodic, some higher salt content can raise it. Tries to avoid salt - Exercise: episodic, seasonal will be more active at times Admits very rare dizziness episode - prior BPPV improved Denies CP, dyspnea, HA, edema, dizziness  HYPERLIPIDEMIA / Obesity BMI >30 - Reports concerns. Last lipid panel 05/2020 showed dramatic improvement total cholesterol down from 230s to 130, LDL 165 down to 70 - New statin Rosuvastatin 10mg  back in 10/2019  History of night-sweat and chills Says may attribute this to meloxicam, has stopped this and doing better. Rx from Podiatry  Erectile dysfunction Reports difficulty maintaining erection, worse with BP issues in past. Never on med  Health Maintenance: Did not take COVID19 vaccine  Due for Flu Shot, declines today despite counseling on benefits   Depression screen [facility] 2/9 05/23/2020 11/15/2019 03/23/2019  Decreased Interest 0 0 0  Down, Depressed, Hopeless 0 0 0  PHQ - 2 Score 0 0 0    Social History   Tobacco Use  . Smoking status: Former Smoker    Packs/day: 0.50    Types: Cigarettes    Quit date: 04/15/2016    Years since quitting: 4.1  . Smokeless tobacco: Former 05/23/2019  . Vaping Use: Never used  Substance Use Topics  . Alcohol use: No  . Drug use: No    Review of Systems Per HPI unless specifically indicated above     Objective:    BP 122/75   Pulse 63   Temp 98.2 F (36.8 C) (Temporal)   Resp 16   Ht 6' (1.829 m)   Wt 225 lb (102.1 kg)   SpO2 97%   BMI 30.52 kg/m   Wt Readings from Last 3 Encounters:  05/23/20 225 lb (102.1 kg)  11/15/19 226 lb 9.6 oz (102.8 kg)  05/06/19 210 lb 6.4 oz (95.4 kg)    Physical  Exam Vitals and nursing note reviewed.  Constitutional:      General: He is not in acute distress.    Appearance: He is well-developed. He is not diaphoretic.     Comments: Well-appearing, comfortable, cooperative  HENT:     Head: Normocephalic and atraumatic.  Eyes:     General:        Right eye: No discharge.        Left eye: No discharge.     Conjunctiva/sclera: Conjunctivae normal.  Cardiovascular:     Rate and Rhythm: Normal rate.  Pulmonary:     Effort: Pulmonary effort is normal.  Skin:    General: Skin is warm and dry.     Findings: No erythema or rash.  Neurological:     Mental Status: He is alert and oriented to person, place, and time.  Psychiatric:        Behavior: Behavior normal.     Comments: Well groomed, good eye contact, normal speech and thoughts    Results for orders placed or performed in visit on 05/12/20  Lipid panel  Result Value Ref Range   Cholesterol 134 <200 mg/dL   HDL 46 > OR = 40 mg/dL   Triglycerides 94 05/08/19 mg/dL   LDL Cholesterol (Calc) 70  mg/dL (calc)   Total CHOL/HDL Ratio 2.9 <5.0 (calc)   Non-HDL Cholesterol (Calc) 88 <465 mg/dL (calc)  COMPLETE METABOLIC PANEL WITH GFR  Result Value Ref Range   Glucose, Bld 106 (H) 65 - 99 mg/dL   BUN 13 7 - 25 mg/dL   Creat 6.81 2.75 - 1.70 mg/dL   GFR, Est Non African American 97 > OR = 60 mL/min/1.54m2   GFR, Est African American 113 > OR = 60 mL/min/1.59m2   BUN/Creatinine Ratio NOT APPLICABLE 6 - 22 (calc)   Sodium 139 135 - 146 mmol/L   Potassium 4.2 3.5 - 5.3 mmol/L   Chloride 106 98 - 110 mmol/L   CO2 23 20 - 32 mmol/L   Calcium 9.1 8.6 - 10.3 mg/dL   Total Protein 7.0 6.1 - 8.1 g/dL   Albumin 4.6 3.6 - 5.1 g/dL   Globulin 2.4 1.9 - 3.7 g/dL (calc)   AG Ratio 1.9 1.0 - 2.5 (calc)   Total Bilirubin 0.4 0.2 - 1.2 mg/dL   Alkaline phosphatase (APISO) 147 (H) 35 - 144 U/L   AST 34 10 - 35 U/L   ALT 46 9 - 46 U/L      Assessment & Plan:   Problem List Items Addressed This Visit     Obesity (BMI 30.0-34.9)    Down 1 lb in 6 months Encourage keep improving lifestyle diet and exercise regimen      Mixed hyperlipidemia - Primary    Dramatic improved cholesterol control on statin now Last lipid panel 05/2020, LDL 160 to 70 Calculated ASCVD 10 yr risk score >9% as HTN now  Plan: 1. CONTINUE Rosuvastatin 10mg  nightly 2. Encourage improved lifestyle - low carb/cholesterol, reduce portion size, continue improving regular exercise - he is still on ASA 81mg  daily, has not changed. Discussed risk/benefit, it is not bothering him now, he can continue both ASA and Statin for now, in future age 35-70+ can consider stopping ASA      Relevant Medications   sildenafil (REVATIO) 20 MG tablet   Essential hypertension    Improved HTN control now Home results reviewed No known complications    Plan:  1. Continue Amlodipine 10mg  daily 2. Encourage improved lifestyle - low sodium diet, regular exercise 3. Continue monitor BP outside office, bring readings to next visit, if persistently >140/90 or new symptoms notify office sooner      Relevant Medications   sildenafil (REVATIO) 20 MG tablet    Other Visit Diagnoses    Erectile dysfunction, unspecified erectile dysfunction type       Relevant Medications   sildenafil (REVATIO) 20 MG tablet     #ED Chronic problem worse now with HTN/BP med Trial on PDE5 Sildenafil, new rx 20mg  take 1-5 as instructed, Goodrx, coupon #90 pills    Meds ordered this encounter  Medications  . sildenafil (REVATIO) 20 MG tablet    Sig: Take 1-5 pills about 30 min prior to sex. Start with 1 and increase as needed.    Dispense:  90 tablet    Refill:  2      Follow up plan: Return in about 6 months (around 11/21/2020) for 6 month for fasting lab only then 1 week later Annual Physical.  Future labs ordered for 11/2020   , DO [facility] 05/23/2020, 11:06 AM

## 2020-05-23 NOTE — Assessment & Plan Note (Signed)
Improved HTN control now Home results reviewed No known complications    Plan:  1. Continue Amlodipine 10mg  daily 2. Encourage improved lifestyle - low sodium diet, regular exercise 3. Continue monitor BP outside office, bring readings to next visit, if persistently >140/90 or new symptoms notify office sooner

## 2020-05-23 NOTE — Assessment & Plan Note (Signed)
Down 1 lb in 6 months Encourage keep improving lifestyle diet and exercise regimen

## 2020-05-23 NOTE — Patient Instructions (Addendum)
Thank you for coming to the office today.  1. Chemistry - Mostly normal, including kidney and liver function.  2. Cholesterol - Major improvement on Rosuvastatin 10mg , LDL from 165 down to 70, all other cholesterol results normal. The 10-year ASCVD risk score DC Jr., et al., 2013) is: 4%  DUE for FASTING BLOOD WORK (no food or drink after midnight before the lab appointment, only water or coffee without cream/sugar on the morning of)  SCHEDULE "Lab Only" visit in the morning at the clinic for lab draw in 6 MONTHS   - Make sure Lab Only appointment is at about 1 week before your next appointment, so that results will be available  For Lab Results, once available within 2-3 days of blood draw, you can can log in to MyChart online to view your results and a brief explanation. Also, we can discuss results at next follow-up visit.    Please schedule a Follow-up Appointment to: Return in about 6 months (around 11/21/2020) for 6 month for fasting lab only then 1 week later Annual Physical.  If you have any other questions or concerns, please feel free to call the office or send a message through MyChart. You may also schedule an earlier appointment if necessary.  Additionally, you may be receiving a survey about your experience at our office within a few days to 1 week by e-mail or mail. We value your feedback.  01/21/2021, DO [facility], [facility]

## 2020-08-05 ENCOUNTER — Other Ambulatory Visit: Payer: Self-pay | Admitting: Family Medicine

## 2020-08-05 DIAGNOSIS — J320 Chronic maxillary sinusitis: Secondary | ICD-10-CM

## 2020-08-06 NOTE — Telephone Encounter (Signed)
Requested Prescriptions  Pending Prescriptions Disp Refills  . montelukast (SINGULAIR) 10 MG tablet [Pharmacy Med Name: MONTELUKAST 10MG  TABLETS] 90 tablet 0    Sig: TAKE 1 TABLET(10 MG) BY MOUTH AT BEDTIME     Pulmonology:  Leukotriene Inhibitors Passed - 08/05/2020  3:19 AM      Passed - Valid encounter within last 12 months    Recent Outpatient Visits          2 months ago Mixed hyperlipidemia   [facility] [place], [place], [place]   8 months ago Annual physical exam   [facility], DO   1 year ago Chronic maxillary sinusitis   [facility], DO   1 year ago Acute bilateral low back pain without sciatica   [facility], DO   1 year ago Annual physical exam   [facility], DO      Future Appointments            In 3 months Smitty Cords, Althea Charon, DO [facility], [facility]

## 2020-08-13 ENCOUNTER — Ambulatory Visit (INDEPENDENT_AMBULATORY_CARE_PROVIDER_SITE_OTHER)
Admission: EM | Admit: 2020-08-13 | Discharge: 2020-08-13 | Disposition: A | Discharge status: Home / Self Care | Payer: BC Managed Care – PPO | Source: Home / Self Care | Attending: Family Medicine | Admitting: Family Medicine

## 2020-08-13 ENCOUNTER — Other Ambulatory Visit: Payer: Self-pay

## 2020-08-13 DIAGNOSIS — Z87891 Personal history of nicotine dependence: Secondary | ICD-10-CM | POA: Diagnosis not present

## 2020-08-13 DIAGNOSIS — Z880 Allergy status to penicillin: Secondary | ICD-10-CM | POA: Diagnosis not present

## 2020-08-13 DIAGNOSIS — I4510 Unspecified right bundle-branch block: Secondary | ICD-10-CM | POA: Diagnosis not present

## 2020-08-13 DIAGNOSIS — J1282 Pneumonia due to coronavirus disease 2019: Secondary | ICD-10-CM | POA: Diagnosis not present

## 2020-08-13 DIAGNOSIS — R0602 Shortness of breath: Secondary | ICD-10-CM | POA: Diagnosis not present

## 2020-08-13 DIAGNOSIS — J189 Pneumonia, unspecified organism: Secondary | ICD-10-CM | POA: Diagnosis not present

## 2020-08-13 DIAGNOSIS — Z79899 Other long term (current) drug therapy: Secondary | ICD-10-CM | POA: Diagnosis not present

## 2020-08-13 DIAGNOSIS — Z7951 Long term (current) use of inhaled steroids: Secondary | ICD-10-CM | POA: Diagnosis not present

## 2020-08-13 DIAGNOSIS — I517 Cardiomegaly: Secondary | ICD-10-CM | POA: Diagnosis not present

## 2020-08-13 DIAGNOSIS — D75839 Thrombocytosis, unspecified: Secondary | ICD-10-CM | POA: Diagnosis not present

## 2020-08-13 DIAGNOSIS — J9690 Respiratory failure, unspecified, unspecified whether with hypoxia or hypercapnia: Secondary | ICD-10-CM | POA: Diagnosis not present

## 2020-08-13 DIAGNOSIS — Z7982 Long term (current) use of aspirin: Secondary | ICD-10-CM | POA: Diagnosis not present

## 2020-08-13 DIAGNOSIS — R7989 Other specified abnormal findings of blood chemistry: Secondary | ICD-10-CM | POA: Diagnosis not present

## 2020-08-13 DIAGNOSIS — E782 Mixed hyperlipidemia: Secondary | ICD-10-CM | POA: Diagnosis not present

## 2020-08-13 DIAGNOSIS — U071 COVID-19: Secondary | ICD-10-CM

## 2020-08-13 DIAGNOSIS — E876 Hypokalemia: Secondary | ICD-10-CM | POA: Diagnosis not present

## 2020-08-13 DIAGNOSIS — E871 Hypo-osmolality and hyponatremia: Secondary | ICD-10-CM | POA: Diagnosis not present

## 2020-08-13 DIAGNOSIS — I10 Essential (primary) hypertension: Secondary | ICD-10-CM | POA: Diagnosis not present

## 2020-08-13 DIAGNOSIS — E860 Dehydration: Secondary | ICD-10-CM | POA: Diagnosis not present

## 2020-08-13 DIAGNOSIS — J9601 Acute respiratory failure with hypoxia: Secondary | ICD-10-CM | POA: Diagnosis not present

## 2020-08-13 MED ORDER — ACETAMINOPHEN 500 MG PO TABS
1000.0000 mg | ORAL_TABLET | Freq: Once | ORAL | Status: AC
  Administered 2020-08-13: 1000 mg via ORAL

## 2020-08-13 MED ORDER — BENZONATATE 200 MG PO CAPS: 200.0000 mg | ORAL_CAPSULE | Freq: Three times a day (TID) | ORAL | 0 refills | Status: DC | PRN

## 2020-08-13 MED ORDER — KETOROLAC TROMETHAMINE 10 MG PO TABS: 10.0000 mg | ORAL_TABLET | Freq: Four times a day (QID) | ORAL | 0 refills | Status: DC | PRN

## 2020-08-13 NOTE — ED Triage Notes (Signed)
Patient states that he has been having a fever and cough since Thursday. Reports that he has been vomiting with diarrhea.

## 2020-08-13 NOTE — ED Provider Notes (Signed)
[facility]    CSN: [id number] Arrival date & time: 08/13/20  1400      History   Chief Complaint Chief Complaint  Patient presents with  . Fever    [phone]   HPI  55 year old male presents with fever, cough, body aches, diarrhea.  Patient states that he has had symptoms since Tuesday.  Worsened Thursday.  Patient currently febrile at 101.2.  He reports ongoing fever, chills, body aches.  Also having diarrhea and cough.  No relieving factors.  He rates his pain as 10/10 in severity.  He reports difficulty sleeping.  He is unable to get any relief.  Patient Active Problem List   Diagnosis Date Noted  . Obesity (BMI 30.0-34.9) 11/15/2019  . Chronic frontal sinusitis 05/06/2019  . Chronic allergic rhinitis 05/06/2019  . Mixed hyperlipidemia 10/26/2018  . Essential hypertension 09/25/2018  . Benign paroxysmal positional vertigo due to bilateral vestibular disorder 09/25/2018  . Tear of medial meniscus of knee 04/03/2017   Past Surgical History:  Procedure Laterality Date  . KNEE ARTHROSCOPY WITH MEDIAL MENISECTOMY Left 04/16/2017   Procedure: KNEE ARTHROSCOPY WITH PARTIAL MEDIAL MENISECTOMY, CHONDROPLASTY, PARTIAL SYNOVECTOMY;  Surgeon: Lovell Sheehan, MD;  Location: [facility] ORS;  Service: Orthopedics;  Laterality: Left;  . SURGERY OF LIP  2008  . TONSILLECTOMY  1974  . TOOTH EXTRACTION  2008  . vasectomy  1997   Home Medications    Prior to Admission medications   Medication Sig Start Date End Date Taking? Authorizing Provider  amLODipine (NORVASC) 10 MG tablet Take 1 tablet (10 mg total) by mouth at bedtime. 11/15/19  Yes Karamalegos, Alexander J, DO  ASPIRIN 81 PO Take 81 mg by mouth daily.   Yes [provider]  esomeprazole (NEXIUM) 20 MG packet Take 20 mg by mouth daily before breakfast.   Yes [provider]  fluticasone (FLONASE) 50 MCG/ACT nasal spray Place 2 sprays into both nostrils daily. Use for 4-6 weeks then stop and use  seasonally or as needed. 09/25/18  Yes Karamalegos, Alexander J, DO  ipratropium (ATROVENT) 0.06 % nasal spray USE 2 SPRAYS IN EACH NOSTRIL FOUR TIMES DAILY AS NEEDED FOR RHINITIS 06/04/19  Yes Karamalegos, Devonne Doughty, DO  meclizine (ANTIVERT) 25 MG tablet Take 1 tablet (25 mg total) by mouth 3 (three) times daily as needed for dizziness. 09/25/18  Yes Karamalegos, Devonne Doughty, DO  montelukast (SINGULAIR) 10 MG tablet TAKE 1 TABLET(10 MG) BY MOUTH AT BEDTIME 08/06/20  Yes Karamalegos, Devonne Doughty, DO  rosuvastatin (CRESTOR) 10 MG tablet Take 1 tablet (10 mg total) by mouth at bedtime. 11/15/19  Yes Karamalegos, Devonne Doughty, DO  sildenafil (REVATIO) 20 MG tablet Take 1-5 pills about 30 min prior to sex. Start with 1 and increase as needed. 05/23/20  Yes Karamalegos, Devonne Doughty, DO  benzonatate (TESSALON) 200 MG capsule Take 1 capsule (200 mg total) by mouth 3 (three) times daily as needed for cough. 08/13/20   Coral Spikes, DO  ketorolac (TORADOL) 10 MG tablet Take 1 tablet (10 mg total) by mouth every 6 (six) hours as needed for moderate pain or severe pain. 08/13/20   Coral Spikes, DO    Family History Family History  Problem Relation Age of Onset  . Breast cancer Mother     Social History Social History   Tobacco Use  . Smoking status: Former Smoker    Packs/day: 0.50    Types: Cigarettes    Quit date: 04/15/2016    Years  since quitting: 4.3  . Smokeless tobacco: Former Clinical biochemist  . Vaping Use: Never used  Substance Use Topics  . Alcohol use: No  . Drug use: No     Allergies   Penicillins   Review of Systems Review of Systems Per HPI  Physical Exam Triage Vital Signs ED Triage Vitals  Enc Vitals Group     BP --      Pulse Rate 08/13/20 1712 89     Resp 08/13/20 1712 (!) 22     Temp 08/13/20 1712 (!) 101.2 F (38.4 C)     Temp Source 08/13/20 1712 Oral     SpO2 08/13/20 1712 99 %     Weight 08/13/20 1556 225 lb (102.1 kg)     Height 08/13/20 1556 6' (1.829 m)      Head Circumference --      Peak Flow --      Pain Score 08/13/20 1556 10     Pain Loc --      Pain Edu? --      Excl. in GC? --    Updated Vital Signs Pulse 89   Temp (!) 101.2 F (38.4 C) (Oral)   Resp (!) 22   Ht 6' (1.829 m)   Wt 102.1 kg   SpO2 99%   BMI 30.52 kg/m   Visual Acuity Right Eye Distance:   Left Eye Distance:   Bilateral Distance:    Right Eye Near:   Left Eye Near:    Bilateral Near:     Physical Exam Vitals and nursing note reviewed.  Constitutional:      Appearance: He is ill-appearing.  HENT:     Head: Normocephalic and atraumatic.  Eyes:     General:        Right eye: No discharge.        Left eye: No discharge.     Conjunctiva/sclera: Conjunctivae normal.  Cardiovascular:     Rate and Rhythm: Normal rate and regular rhythm.  Pulmonary:     Effort: Pulmonary effort is normal. No respiratory distress.     Breath sounds: Normal breath sounds. No wheezing, rhonchi or rales.  Neurological:     Mental Status: He is alert.  Psychiatric:        Mood and Affect: Mood normal.        Behavior: Behavior normal.    UC Treatments / Results  Labs (all labs ordered are listed, but only abnormal results are displayed) Labs Reviewed  SARS CORONAVIRUS 2 (TAT 6-24 HRS)    EKG   Radiology No results found.  Procedures Procedures (including critical care time)  Medications Ordered in UC Medications  acetaminophen (TYLENOL) tablet 1,000 mg (1,000 mg Oral Given 08/13/20 1716)    Initial Impression / Assessment and Plan / UC Course  I have reviewed the triage vital signs and the nursing notes.  Pertinent labs & imaging results that were available during my care of the patient were reviewed by me and considered in my medical decision making (see chart for details).    55 year old male presents with suspected COVID-19.  Awaiting test results.  Toradol for body aches.  Tessalon Perles for cough.  Awaiting test result.  Final Clinical  Impressions(s) / UC Diagnoses   Final diagnoses:  COVID     Discharge Instructions     Rest.  Lots of fluids.  Medication as prescribed.  Take care  Dr. Adriana Simas    ED Prescriptions  Medication Sig Dispense Auth. Provider   ketorolac (TORADOL) 10 MG tablet  (Status: Discontinued) Take 1 tablet (10 mg total) by mouth every 6 (six) hours as needed for moderate pain or severe pain. 20 tablet Sylvan Lahm G, DO   benzonatate (TESSALON) 200 MG capsule  (Status: Discontinued) Take 1 capsule (200 mg total) by mouth 3 (three) times daily as needed for cough. 30 capsule Brittanya Winburn G, DO   benzonatate (TESSALON) 200 MG capsule Take 1 capsule (200 mg total) by mouth 3 (three) times daily as needed for cough. 30 capsule Xavia Kniskern G, DO   ketorolac (TORADOL) 10 MG tablet Take 1 tablet (10 mg total) by mouth every 6 (six) hours as needed for moderate pain or severe pain. 20 tablet Tommie Sams, DO     PDMP not reviewed this encounter.   Tommie Sams, [place] 08/13/20 1849

## 2020-08-13 NOTE — Discharge Instructions (Signed)
Rest.  Lots of fluids.  Medication as prescribed.  Take care  Dr. Trayvion Embleton  

## 2020-08-14 LAB — SARS CORONAVIRUS 2 (TAT 6-24 HRS): SARS Coronavirus 2: POSITIVE — AB

## 2020-08-16 ENCOUNTER — Emergency Department: Payer: BC Managed Care – PPO

## 2020-08-16 ENCOUNTER — Other Ambulatory Visit: Payer: Self-pay

## 2020-08-16 ENCOUNTER — Inpatient Hospital Stay
Admission: EM | Admit: 2020-08-16 | Discharge: 2020-08-28 | DRG: 177 | Disposition: A | Discharge status: Home / Self Care | Payer: BC Managed Care – PPO | Attending: Internal Medicine | Admitting: Internal Medicine

## 2020-08-16 DIAGNOSIS — Z7982 Long term (current) use of aspirin: Secondary | ICD-10-CM

## 2020-08-16 DIAGNOSIS — R0602 Shortness of breath: Secondary | ICD-10-CM | POA: Diagnosis not present

## 2020-08-16 DIAGNOSIS — I10 Essential (primary) hypertension: Secondary | ICD-10-CM | POA: Diagnosis present

## 2020-08-16 DIAGNOSIS — Z79899 Other long term (current) drug therapy: Secondary | ICD-10-CM | POA: Diagnosis not present

## 2020-08-16 DIAGNOSIS — J9601 Acute respiratory failure with hypoxia: Secondary | ICD-10-CM | POA: Diagnosis present

## 2020-08-16 DIAGNOSIS — Z87891 Personal history of nicotine dependence: Secondary | ICD-10-CM

## 2020-08-16 DIAGNOSIS — U071 COVID-19: Principal | ICD-10-CM | POA: Diagnosis present

## 2020-08-16 DIAGNOSIS — E876 Hypokalemia: Secondary | ICD-10-CM | POA: Diagnosis present

## 2020-08-16 DIAGNOSIS — E860 Dehydration: Secondary | ICD-10-CM | POA: Diagnosis present

## 2020-08-16 DIAGNOSIS — Z880 Allergy status to penicillin: Secondary | ICD-10-CM | POA: Diagnosis not present

## 2020-08-16 DIAGNOSIS — D75839 Thrombocytosis, unspecified: Secondary | ICD-10-CM | POA: Diagnosis present

## 2020-08-16 DIAGNOSIS — J1282 Pneumonia due to coronavirus disease 2019: Secondary | ICD-10-CM | POA: Diagnosis present

## 2020-08-16 DIAGNOSIS — R7989 Other specified abnormal findings of blood chemistry: Secondary | ICD-10-CM | POA: Diagnosis present

## 2020-08-16 DIAGNOSIS — Z7951 Long term (current) use of inhaled steroids: Secondary | ICD-10-CM | POA: Diagnosis not present

## 2020-08-16 DIAGNOSIS — E782 Mixed hyperlipidemia: Secondary | ICD-10-CM | POA: Diagnosis present

## 2020-08-16 DIAGNOSIS — E871 Hypo-osmolality and hyponatremia: Secondary | ICD-10-CM | POA: Diagnosis present

## 2020-08-16 DIAGNOSIS — I4510 Unspecified right bundle-branch block: Secondary | ICD-10-CM | POA: Diagnosis present

## 2020-08-16 HISTORY — DX: Hyperlipidemia, unspecified: E78.5

## 2020-08-16 HISTORY — DX: Essential (primary) hypertension: I10

## 2020-08-16 HISTORY — DX: Allergic rhinitis, unspecified: J30.9

## 2020-08-16 LAB — C-REACTIVE PROTEIN: CRP: 18 mg/dL — ABNORMAL HIGH (ref <1.0)

## 2020-08-16 LAB — COMPREHENSIVE METABOLIC PANEL
ALT: 34 U/L (ref 0–44)
AST: 55 U/L — ABNORMAL HIGH (ref 15–41)
Albumin: 3.7 g/dL (ref 3.5–5.0)
Alkaline Phosphatase: 69 U/L (ref 38–126)
Anion gap: 15 (ref 5–15)
BUN: 16 mg/dL (ref 6–20)
CO2: 19 mmol/L — ABNORMAL LOW (ref 22–32)
Calcium: 8.5 mg/dL — ABNORMAL LOW (ref 8.9–10.3)
Chloride: 93 mmol/L — ABNORMAL LOW (ref 98–111)
Creatinine, Ser: 0.98 mg/dL (ref 0.61–1.24)
GFR, Estimated: >60 mL/min (ref >60)
Glucose, Bld: 108 mg/dL — ABNORMAL HIGH (ref 70–99)
Potassium: 3.4 mmol/L — ABNORMAL LOW (ref 3.5–5.1)
Sodium: 127 mmol/L — ABNORMAL LOW (ref 135–145)
Total Bilirubin: 0.9 mg/dL (ref 0.3–1.2)
Total Protein: 7.7 g/dL (ref 6.5–8.1)

## 2020-08-16 LAB — URINALYSIS, COMPLETE (UACMP) WITH MICROSCOPIC
Bacteria, UA: NONE SEEN
Bilirubin Urine: NEGATIVE
Glucose, UA: NEGATIVE mg/dL
Ketones, ur: 5 mg/dL — AB
Leukocytes,Ua: NEGATIVE
Nitrite: NEGATIVE
Protein, ur: 100 mg/dL — AB
Specific Gravity, Urine: 1.009 (ref 1.005–1.030)
pH: 6 (ref 5.0–8.0)

## 2020-08-16 LAB — PROCALCITONIN: Procalcitonin: 0.15 ng/mL

## 2020-08-16 LAB — CBC WITH DIFFERENTIAL/PLATELET
Abs Immature Granulocytes: 0.05 10*3/uL (ref 0.00–0.07)
Basophils Absolute: 0 10*3/uL (ref 0.0–0.1)
Basophils Relative: 0 %
Eosinophils Absolute: 0 10*3/uL (ref 0.0–0.5)
Eosinophils Relative: 0 %
HCT: 40.3 % (ref 39.0–52.0)
Hemoglobin: 14.1 g/dL (ref 13.0–17.0)
Immature Granulocytes: 1 %
Lymphocytes Relative: 16 %
Lymphs Abs: 0.8 10*3/uL (ref 0.7–4.0)
MCH: 29.6 pg (ref 26.0–34.0)
MCHC: 35 g/dL (ref 30.0–36.0)
MCV: 84.5 fL (ref 80.0–100.0)
Monocytes Absolute: 0.2 10*3/uL (ref 0.1–1.0)
Monocytes Relative: 4 %
Neutro Abs: 4.1 10*3/uL (ref 1.7–7.7)
Neutrophils Relative %: 79 %
Platelets: 206 10*3/uL (ref 150–400)
RBC: 4.77 MIL/uL (ref 4.22–5.81)
RDW: 12.7 % (ref 11.5–15.5)
WBC: 5.2 10*3/uL (ref 4.0–10.5)
nRBC: 0 % (ref 0.0–0.2)

## 2020-08-16 LAB — MAGNESIUM: Magnesium: 2.3 mg/dL (ref 1.7–2.4)

## 2020-08-16 LAB — TRIGLYCERIDES: Triglycerides: 101 mg/dL (ref <150)

## 2020-08-16 LAB — LACTIC ACID, PLASMA: Lactic Acid, Venous: 1.3 mmol/L (ref 0.5–1.9)

## 2020-08-16 LAB — LACTATE DEHYDROGENASE: LDH: 421 U/L — ABNORMAL HIGH (ref 98–192)

## 2020-08-16 LAB — TROPONIN I (HIGH SENSITIVITY): Troponin I (High Sensitivity): 18 ng/L — ABNORMAL HIGH (ref <18)

## 2020-08-16 LAB — FIBRINOGEN: Fibrinogen: 737 mg/dL — ABNORMAL HIGH (ref 210–475)

## 2020-08-16 LAB — FERRITIN: Ferritin: 1023 ng/mL — ABNORMAL HIGH (ref 24–336)

## 2020-08-16 MED ORDER — ONDANSETRON HCL 4 MG/2ML IJ SOLN
4.0000 mg | Freq: Once | INTRAMUSCULAR | Status: AC
  Administered 2020-08-16: 4 mg via INTRAVENOUS

## 2020-08-16 MED ORDER — ONDANSETRON HCL 4 MG/2ML IJ SOLN: 4.0000 mg | Freq: Four times a day (QID) | INTRAMUSCULAR | Status: DC | PRN

## 2020-08-16 MED ORDER — SODIUM CHLORIDE 0.9 % IV SOLN
100.0000 mg | Freq: Every day | INTRAVENOUS | Status: AC
  Administered 2020-08-17 – 2020-08-20 (×4): 100 mg via INTRAVENOUS
  Filled 2020-08-16 (×3): qty 100
  Filled 2020-08-16: qty 20

## 2020-08-16 MED ORDER — ACETAMINOPHEN 325 MG PO TABS
650.0000 mg | ORAL_TABLET | Freq: Once | ORAL | Status: AC
  Administered 2020-08-16: 650 mg via ORAL
  Filled 2020-08-16: qty 2

## 2020-08-16 MED ORDER — DEXAMETHASONE 6 MG PO TABS
6.0000 mg | ORAL_TABLET | Freq: Every day | ORAL | Status: DC
  Filled 2020-08-16: qty 1

## 2020-08-16 MED ORDER — BENZONATATE 100 MG PO CAPS
200.0000 mg | ORAL_CAPSULE | Freq: Three times a day (TID) | ORAL | Status: DC | PRN
  Filled 2020-08-16 (×2): qty 2

## 2020-08-16 MED ORDER — ONDANSETRON HCL 4 MG/2ML IJ SOLN
INTRAMUSCULAR | Status: AC
  Filled 2020-08-16: qty 2

## 2020-08-16 MED ORDER — IPRATROPIUM-ALBUTEROL 20-100 MCG/ACT IN AERS
1.0000 | INHALATION_SPRAY | Freq: Four times a day (QID) | RESPIRATORY_TRACT | Status: DC
  Administered 2020-08-17 – 2020-08-28 (×42): 1 via RESPIRATORY_TRACT
  Filled 2020-08-16 (×2): qty 4

## 2020-08-16 MED ORDER — POTASSIUM CHLORIDE 10 MEQ/100ML IV SOLN
10.0000 meq | INTRAVENOUS | Status: AC
  Administered 2020-08-16 – 2020-08-17 (×4): 10 meq via INTRAVENOUS
  Filled 2020-08-16 (×4): qty 100

## 2020-08-16 MED ORDER — ACETAMINOPHEN 325 MG PO TABS
650.0000 mg | ORAL_TABLET | Freq: Four times a day (QID) | ORAL | Status: DC | PRN
  Administered 2020-08-26 – 2020-08-28 (×2): 650 mg via ORAL
  Filled 2020-08-16 (×2): qty 2

## 2020-08-16 MED ORDER — METHYLPREDNISOLONE SODIUM SUCC 125 MG IJ SOLR
125.0000 mg | Freq: Once | INTRAMUSCULAR | Status: AC
  Administered 2020-08-16: 125 mg via INTRAVENOUS
  Filled 2020-08-16: qty 2

## 2020-08-16 MED ORDER — ROSUVASTATIN CALCIUM 10 MG PO TABS
10.0000 mg | ORAL_TABLET | Freq: Every day | ORAL | Status: DC
Start: 2020-08-16 — End: 2020-08-20
  Administered 2020-08-16 – 2020-08-19 (×4): 10 mg via ORAL
  Filled 2020-08-16 (×4): qty 1

## 2020-08-16 MED ORDER — SODIUM CHLORIDE 0.9 % IV SOLN
200.0000 mg | Freq: Once | INTRAVENOUS | Status: AC
  Administered 2020-08-16: 200 mg via INTRAVENOUS
  Filled 2020-08-16: qty 200

## 2020-08-16 MED ORDER — ENOXAPARIN SODIUM 60 MG/0.6ML ~~LOC~~ SOLN
50.0000 mg | SUBCUTANEOUS | Status: DC
  Administered 2020-08-17 – 2020-08-19 (×3): 50 mg via SUBCUTANEOUS
  Filled 2020-08-16 (×3): qty 0.6

## 2020-08-16 MED ORDER — ALBUTEROL SULFATE HFA 108 (90 BASE) MCG/ACT IN AERS
1.0000 | INHALATION_SPRAY | RESPIRATORY_TRACT | Status: DC | PRN
  Filled 2020-08-16: qty 6.7

## 2020-08-16 MED ORDER — ACETAMINOPHEN 650 MG RE SUPP: 650.0000 mg | Freq: Four times a day (QID) | RECTAL | Status: DC | PRN

## 2020-08-16 MED ORDER — SODIUM CHLORIDE 0.9 % IV BOLUS
500.0000 mL | Freq: Once | INTRAVENOUS | Status: AC
  Administered 2020-08-16: 500 mL via INTRAVENOUS

## 2020-08-16 NOTE — ED Notes (Signed)
First Rn note:  Pt comes into the ED for [facility].  Pt is COVID positive and has increased SHOB.  Pt currently sating at 72% room air.  Pt taken to be triaged next.

## 2020-08-16 NOTE — Consult Note (Signed)
Remdesivir - Pharmacy Brief Note   O:  ALT: 34 CXR: Severe multilobar bilateral pneumonia compatible with reported COVID infection. SpO2: 94% on 2L [place]   A/P:  Remdesivir 200 mg IVPB once followed by 100 mg IVPB daily x 4 days.   Martyn Malay, [facility] 08/16/2020 5:40 PM

## 2020-08-16 NOTE — ED Provider Notes (Signed)
[facility] Emergency Department Provider Note ____________________________________________   Event Date/Time   First MD Initiated Contact with Patient 08/16/20 1649     (approximate)  I have reviewed the triage vital signs and the nursing notes.   HISTORY  Chief Complaint Shortness of Breath    HPI Timothy Lang is a 55 y.o. male with PMH as noted below who presents with worsening shortness of breath and weakness over the last several days after being diagnosed with Covid on 1/2.  The patient states that he has had shortness of breath at rest which has been gradually worsening.  He has a nonproductive cough.  He also reports nausea and inability to tolerate anything by mouth, as well as a headache, fever, and weakness.   History reviewed. No pertinent past medical history.  Patient Active Problem List   Diagnosis Date Noted  . Pneumonia due to COVID-19 virus 08/16/2020  . Obesity (BMI 30.0-34.9) 11/15/2019  . Chronic frontal sinusitis 05/06/2019  . Chronic allergic rhinitis 05/06/2019  . Mixed hyperlipidemia 10/26/2018  . Essential hypertension 09/25/2018  . Benign paroxysmal positional vertigo due to bilateral vestibular disorder 09/25/2018  . Tear of medial meniscus of knee 04/03/2017    Past Surgical History:  Procedure Laterality Date  . KNEE ARTHROSCOPY WITH MEDIAL MENISECTOMY Left 04/16/2017   Procedure: KNEE ARTHROSCOPY WITH PARTIAL MEDIAL MENISECTOMY, CHONDROPLASTY, PARTIAL SYNOVECTOMY;  Surgeon: Lovell Sheehan, MD;  Location: [facility] ORS;  Service: Orthopedics;  Laterality: Left;  . SURGERY OF LIP  2008  . TONSILLECTOMY  1974  . TOOTH EXTRACTION  2008  . vasectomy  1997    Prior to Admission medications   Medication Sig Start Date End Date Taking? Authorizing Provider  amLODipine (NORVASC) 10 MG tablet Take 1 tablet (10 mg total) by mouth at bedtime. 11/15/19   Karamalegos, Devonne Doughty, DO  ASPIRIN 81 PO Take 81 mg by mouth daily.     [provider]  benzonatate (TESSALON) 200 MG capsule Take 1 capsule (200 mg total) by mouth 3 (three) times daily as needed for cough. 08/13/20   Coral Spikes, DO  esomeprazole (NEXIUM) 20 MG packet Take 20 mg by mouth daily before breakfast.    [provider]  ketorolac (TORADOL) 10 MG tablet Take 1 tablet (10 mg total) by mouth every 6 (six) hours as needed for moderate pain or severe pain. 08/13/20   Coral Spikes, DO  montelukast (SINGULAIR) 10 MG tablet TAKE 1 TABLET(10 MG) BY MOUTH AT BEDTIME 08/06/20   Karamalegos, Devonne Doughty, DO  rosuvastatin (CRESTOR) 10 MG tablet Take 1 tablet (10 mg total) by mouth at bedtime. 11/15/19   Karamalegos, Devonne Doughty, DO  sildenafil (REVATIO) 20 MG tablet Take 1-5 pills about 30 min prior to sex. Start with 1 and increase as needed. 05/23/20   Karamalegos, Devonne Doughty, DO    Allergies Penicillins  Family History  Problem Relation Age of Onset  . Breast cancer Mother     Social History Social History   Tobacco Use  . Smoking status: Former Smoker    Packs/day: 0.50    Types: Cigarettes    Quit date: 04/15/2016    Years since quitting: 4.3  . Smokeless tobacco: Former Network engineer  . Vaping Use: Never used  Substance Use Topics  . Alcohol use: No  . Drug use: No    Review of Systems  Constitutional: Positive for fever and weakness. Eyes: No redness. ENT: No sore throat.  Cardiovascular: Denies chest pain. Respiratory: Positive for shortness of breath. Gastrointestinal: Positive for nausea Genitourinary: Negative for dysuria.  Musculoskeletal: Positive for body aches. Skin: Negative for rash. Neurological: Positive for headache.   ____________________________________________   PHYSICAL EXAM:  VITAL SIGNS: ED Triage Vitals  Enc Vitals Group     BP 08/16/20 1259 112/72     Pulse Rate 08/16/20 1259 88     Resp 08/16/20 1259 (!) 25     Temp 08/16/20 1259 (!) 100.5 F (38.1 C)     Temp Source 08/16/20 1259  Oral     SpO2 08/16/20 1259 94 %     Weight 08/16/20 1257 224 lb 13.9 oz (102 kg)     Height 08/16/20 1257 6' (1.829 m)     Head Circumference --      Peak Flow --      Pain Score 08/16/20 1257 10     Pain Loc --      Pain Edu? --      Excl. in GC? --     Constitutional: Alert and oriented.  Weak and tired appearing but in no acute distress. Eyes: Conjunctivae are normal.  Head: Atraumatic. Nose: No congestion/rhinnorhea. Mouth/Throat: Mucous membranes are slightly dry Neck: Normal range of motion.  Cardiovascular: Normal rate, regular rhythm.  Good peripheral circulation. Respiratory: Increased respiratory effort.  No retractions.  Gastrointestinal:  No distention.  Musculoskeletal: Extremities warm and well perfused.  Neurologic:  Normal speech and language. No gross focal neurologic deficits are appreciated.  Skin:  Skin is warm and dry. No rash noted. Psychiatric: Mood and affect are normal. Speech and behavior are normal.  ____________________________________________   LABS (all labs ordered are listed, but only abnormal results are displayed)  Labs Reviewed  COMPREHENSIVE METABOLIC PANEL - Abnormal; Notable for the following components:      Result Value   Sodium 127 (*)    Potassium 3.4 (*)    Chloride 93 (*)    CO2 19 (*)    Glucose, Bld 108 (*)    Calcium 8.5 (*)    AST 55 (*)    All other components within normal limits  LACTIC ACID, PLASMA  CBC WITH DIFFERENTIAL/PLATELET  URINALYSIS, COMPLETE (UACMP) WITH MICROSCOPIC  FIBRIN DERIVATIVES D-DIMER ([facility] ONLY)  PROCALCITONIN  LACTATE DEHYDROGENASE  FERRITIN  TRIGLYCERIDES  FIBRINOGEN  C-REACTIVE PROTEIN  TROPONIN I (HIGH SENSITIVITY)   ____________________________________________  EKG  ED ECG REPORT I, Dionne Bucy, the attending physician, personally viewed and interpreted this ECG.  Date: 08/16/2020 EKG Time: 1259 Rate: 86 Rhythm: normal sinus rhythm QRS Axis: normal Intervals:  Incomplete RBBB ST/T Wave abnormalities: normal Narrative Interpretation: no evidence of acute ischemia  ____________________________________________  RADIOLOGY  Chest x-ray interpreted by me shows bilateral multi lobar infiltrates  ____________________________________________   PROCEDURES  Procedure(s) performed: No  Procedures  Critical Care performed: No ____________________________________________   INITIAL IMPRESSION / ASSESSMENT AND PLAN / ED COURSE  Pertinent labs & imaging results that were available during my care of the patient were reviewed by me and considered in my medical decision making (see chart for details).  56 year old male with PMH as noted above on vaccinated for Covid presents with worsening shortness of breath, weakness, nausea, and decreased p.o. intake after being diagnosed with Covid on 1/2.  I reviewed the past medical records in Epic and confirmed the positive test result.  On exam the patient is somewhat weak and tired appearing.  O2 saturation is in the mid 90s on 2  L by nasal cannula, but in the 80s on room air.  He has increased work of breathing but no acute respiratory distress.  Exam is otherwise as described above.  Initial work-up from triage reveals bilateral multi lobar infiltrates on the chest x-ray and hyponatremia.  Presentation is consistent with sequela of Covid-19.  I have ordered remdesivir and steroid as well as fluids due to hyponatremia.  I discussed the case with Dr. Arlean Hopping from the hospitalist service for admission.  ____________________________  Timothy Lang was evaluated in Emergency Department on 08/16/2020 for the symptoms described in the history of present illness. He was evaluated in the context of the global COVID-19 pandemic, which necessitated consideration that the patient might be at risk for infection with the SARS-CoV-2 virus that causes COVID-19. Institutional protocols and algorithms that pertain to the  evaluation of patients at risk for COVID-19 are in a state of rapid change based on information released by regulatory bodies including the CDC and federal and state organizations. These policies and algorithms were followed during the patient's care in the ED.  ____________________________________________   FINAL CLINICAL IMPRESSION(S) / ED DIAGNOSES  Final diagnoses:  COVID-19  Acute respiratory failure with hypoxia (HCC)      NEW MEDICATIONS STARTED DURING THIS VISIT:  New Prescriptions   No medications on file     Note:  This document was prepared using Dragon voice recognition software and may include unintentional dictation errors.   Dionne Bucy, MD 08/16/20 [place]

## 2020-08-16 NOTE — ED Triage Notes (Signed)
Pt comes pov with sob covid pos since sunday. C/o headache, fever, sob, not sleeping. Tachypnic and nauseous.

## 2020-08-16 NOTE — H&P (Signed)
History and Physical    PLEASE NOTE THAT DRAGON DICTATION SOFTWARE WAS USED IN THE CONSTRUCTION OF THIS NOTE.   Timothy Lang:[id number] DOB: 1965-12-12 DOA: 08/16/2020  PCP: Olin Hauser, DO Patient coming from: home   I have personally briefly reviewed patient's old medical records in [place]  Chief Complaint: Shortness of breath  HPI: Timothy Lang is a 55 y.o. male with medical history significant for hypertension, hyperlipidemia who is admitted to [facility] on 08/16/2020 with acute hypoxic respiratory distress in the setting of severe COVID-19 pneumonia after presenting from home to [facility] complaining of shortness of breath.   The patient reports 5 to 6 days of progressive shortness of breath associated with new onset nonproductive cough, intermittent nausea in the absence of any vomiting. He also reports associated subjective fever in the absence of any full body rigors or generalized myalgias or chills.Marland Kitchen He reports that his shortness of breath is not been associated with any orthopnea, PND, or peripheral edema. He has had a mild intermittent frontal headache in the absence of any neck stiffness. He also denies any recent sore throat, wheezing, abdominal pain, diarrhea, or rash. In the setting of his intermittent recent nausea, he acknowledges diminished oral intake over the last several days. Denies any recent dysuria, gross hematuria, or change in urinary urgency/frequency. Not associate with any chest pain, diaphoresis, palpitations, presyncope, or syncope.  He also denies any recent hemoptysis, peripheral erythema, or calf tenderness. No recent trauma or travel.   In the setting of 2 days of the above symptoms, the patient underwent COVID-19 testing as an outpatient on 08/13/2020 which was found to be positive at that time. In the setting of interval worsening of the above respiratory symptoms, the patient elected to  present to [facility] ED today for further evaluation and management of the above. Of note, he denies any known chronic underlying pulmonary pathology, and denies any baseline supplemental oxygen requirements. Acknowledges a history of hypertension, but denies any known history of chronic heart failure or diabetes.    ED Course:  Vital signs in the ED were notable for the following: Temperature max 100.5, heart rate 75-88; blood pressure  112/72 - 140/81; respiratory rate 22-29; initial oxygen saturation noted to be 88% on room air, which subsequently proved to 95 to 96% on 2 L nasal cannula  Labs were notable for the following: CMP was notable for the following: Sodium 127, relative to most recent prior sodium data point of 139 on 05/15/2020, potassium 3.4, chloride 93, bicarbonate 18, anion gap 15, creatinine 0.98, ALT 55. CBC was notable for the following: White blood cell count 5200, hemoglobin 14.1, procalcitonin 0.15. Urinalysis was notable for the following: No white blood cells, leukocyte esterase negative, nitrate negative, but was positive for 5 ketones.  Chest x-ray showed evidence of bilateral patchy airspace opacities consistent with COVID-19 pneumonia in the absence of any evidence of pleural effusion, pulmonary edema, or pneumothorax. EKG shows normal sinus rhythm with incomplete right bundle branch block and heart rate 86, with nonspecific T wave inversion in V1, and no evidence of ST changes, including no evidence of ST elevation.  While in the ED, the following were administered: Solu-Medrol 125 mg IV x1, Zofran 4 mg IV x1, remdesivir was initiated following pharmacy consultation, and a 500 cc IV normal saline bolus. Subsequently, the patient was admitted to the med telemetry floor for further evaluation management of presenting acute hypoxic respiratory distress in  the setting of severe COVID-19 pneumonia.      Review of Systems: As per HPI otherwise 10 point review of systems negative.    History reviewed. No pertinent past medical history.  Past Surgical History:  Procedure Laterality Date  . KNEE ARTHROSCOPY WITH MEDIAL MENISECTOMY Left 04/16/2017   Procedure: KNEE ARTHROSCOPY WITH PARTIAL MEDIAL MENISECTOMY, CHONDROPLASTY, PARTIAL SYNOVECTOMY;  Surgeon: Lovell Sheehan, MD;  Location: [facility] ORS;  Service: Orthopedics;  Laterality: Left;  . SURGERY OF LIP  2008  . TONSILLECTOMY  1974  . TOOTH EXTRACTION  2008  . vasectomy  64    Social History:  reports that he quit smoking about 4 years ago. His smoking use included cigarettes. He smoked 0.50 packs per day. He has quit using smokeless tobacco. He reports that he does not drink alcohol and does not use drugs.   Allergies  Allergen Reactions  . Penicillins Other (See Comments)    Reason unknown, since a child.    Family History  Problem Relation Age of Onset  . Breast cancer Mother      Prior to Admission medications   Medication Sig Start Date End Date Taking? Authorizing Provider  amLODipine (NORVASC) 10 MG tablet Take 1 tablet (10 mg total) by mouth at bedtime. 11/15/19   Karamalegos, Devonne Doughty, DO  ASPIRIN 81 PO Take 81 mg by mouth daily.    [provider]  benzonatate (TESSALON) 200 MG capsule Take 1 capsule (200 mg total) by mouth 3 (three) times daily as needed for cough. 08/13/20   Coral Spikes, DO  esomeprazole (NEXIUM) 20 MG packet Take 20 mg by mouth daily before breakfast.    [provider]  fluticasone (FLONASE) 50 MCG/ACT nasal spray Place 2 sprays into both nostrils daily. Use for 4-6 weeks then stop and use seasonally or as needed. 09/25/18   Karamalegos, Devonne Doughty, DO  ipratropium (ATROVENT) 0.06 % nasal spray USE 2 SPRAYS IN EACH NOSTRIL FOUR TIMES DAILY AS NEEDED FOR RHINITIS 06/04/19   Parks Ranger, Devonne Doughty, DO  ketorolac (TORADOL) 10 MG tablet Take 1 tablet (10 mg total) by mouth every 6 (six) hours as needed for moderate pain or severe pain. 08/13/20   Coral Spikes,  DO  meclizine (ANTIVERT) 25 MG tablet Take 1 tablet (25 mg total) by mouth 3 (three) times daily as needed for dizziness. 09/25/18   Karamalegos, Devonne Doughty, DO  montelukast (SINGULAIR) 10 MG tablet TAKE 1 TABLET(10 MG) BY MOUTH AT BEDTIME 08/06/20   Karamalegos, Alexander J, DO  rosuvastatin (CRESTOR) 10 MG tablet Take 1 tablet (10 mg total) by mouth at bedtime. 11/15/19   Karamalegos, Devonne Doughty, DO  sildenafil (REVATIO) 20 MG tablet Take 1-5 pills about 30 min prior to sex. Start with 1 and increase as needed. 05/23/20   Olin Hauser, DO     Objective    Physical Exam: Vitals:   08/16/20 1636 08/16/20 1641 08/16/20 1700 08/16/20 1730  BP:   121/75 140/81  Pulse: 75 78 77 84  Resp: (!) 36 (!) 27 (!) 29 18  Temp:      TempSrc:      SpO2: (!) 88% 95% 96% 96%  Weight:      Height:        General: appears to be stated age; alert, oriented; slight increased work of breathing noted Skin: warm, dry, no rash Head:  AT/[place] Mouth:  Oral mucosa membranes appear moist, normal dentition Neck: supple; trachea midline Heart:  RRR; did not appreciate any M/R/G Lungs: Slightly diminished bibasilar breath sounds, but otherwise CTAB, did not appreciate any wheezes, rales, or rhonchi Abdomen: + BS; soft, ND, NT Vascular: 2+ pedal pulses b/l; 2+ radial pulses b/l Extremities: no peripheral edema, no muscle wasting Neuro: strength and sensation intact in upper and lower extremities b/l    Labs on Admission: I have personally reviewed following labs and imaging studies  CBC: Recent Labs  Lab 08/16/20 1300  WBC 5.2  NEUTROABS 4.1  HGB 14.1  HCT 40.3  MCV 84.5  PLT 354   Basic Metabolic Panel: Recent Labs  Lab 08/16/20 1300  NA 127*  K 3.4*  CL 93*  CO2 19*  GLUCOSE 108*  BUN 16  CREATININE 0.98  CALCIUM 8.5*   GFR: Estimated Creatinine Clearance: 106.5 mL/min (by C-G formula based on SCr of 0.98 mg/dL). Liver Function Tests: Recent Labs  Lab 08/16/20 1300   AST 55*  ALT 34  ALKPHOS 69  BILITOT 0.9  PROT 7.7  ALBUMIN 3.7   No results for input(s): LIPASE, AMYLASE in the last 168 hours. No results for input(s): AMMONIA in the last 168 hours. Coagulation Profile: No results for input(s): INR, PROTIME in the last 168 hours. Cardiac Enzymes: No results for input(s): CKTOTAL, CKMB, CKMBINDEX, TROPONINI in the last 168 hours. BNP (last 3 results) No results for input(s): PROBNP in the last 8760 hours. HbA1C: No results for input(s): HGBA1C in the last 72 hours. CBG: No results for input(s): GLUCAP in the last 168 hours. Lipid Profile: No results for input(s): CHOL, HDL, LDLCALC, TRIG, CHOLHDL, LDLDIRECT in the last 72 hours. Thyroid Function Tests: No results for input(s): TSH, T4TOTAL, FREET4, T3FREE, THYROIDAB in the last 72 hours. Anemia Panel: No results for input(s): VITAMINB12, FOLATE, FERRITIN, TIBC, IRON, RETICCTPCT in the last 72 hours. Urine analysis: No results found for: COLORURINE, APPEARANCEUR, LABSPEC, PHURINE, GLUCOSEU, HGBUR, BILIRUBINUR, KETONESUR, PROTEINUR, UROBILINOGEN, NITRITE, LEUKOCYTESUR  Radiological Exams on Admission: DG Chest 2 View  Result Date: 08/16/2020 CLINICAL DATA:  55 year old male with history of shortness of breath. Recently diagnosed with COVID infection. EXAM: CHEST - 2 VIEW COMPARISON:  No priors. FINDINGS: Lung volumes are low. Patchy multifocal airspace disease and areas of interstitial prominence throughout the lungs bilaterally, most evident throughout the mid to lower lungs. No pneumothorax. No evidence of pulmonary edema. No pleural effusions. Heart size is normal. Upper mediastinal contours are within normal limits. IMPRESSION: 1. Severe multilobar bilateral pneumonia compatible with reported COVID infection, as above. Electronically Signed   By: Vinnie Langton M.D.   On: 08/16/2020 13:42     EKG: Independently reviewed, with result as described above.    Assessment/Plan   IDRISSA BEVILLE is a 55 y.o. male with medical history significant for hypertension, hyperlipidemia who is admitted to [facility] on 08/16/2020 with acute hypoxic respiratory distress in the setting of severe COVID-19 pneumonia after presenting from home to [facility] complaining of shortness of breath.     Principal Problem:   Pneumonia due to COVID-19 virus Active Problems:   Essential hypertension   Mixed hyperlipidemia   Shortness of breath   Acute hyponatremia   Hypokalemia    #) Severe COVID-19 pneumonia: Diagnosis on the basis of 5 to 6 days of progressive shortness of breath associated new onset nonproductive cough, subjective fever, intermittent nausea, acute hypoxic respiratory distress, as further quantified below, and chest x-ray showing evidence of bilateral patchy airspace opacities consistent with COVID-19  pneumonia, all in the context of a positive COVID-19 test performed on 08/13/2020, which has been validated via chart review in epic. In setting of acute hypoxia, criteria are met from patient's COVID-19 infection to be considered severe in nature. Consequently, there is a Grade 2c rec for dexamethasone, which is further supported by treatment guidance recommendations from [place].  Additionally, in the setting of symptomatic COVID-19 infection requiring hospitalization for further evaluation and management thereof, criteria are met for initiation of remdesivir per treatment guidance recommendations from [place], as opposed to initiating monoclonal ab therapy. Of note, ALT found to be less than 220. Therefore, there is no contraindication for initiation of remdesivir on the basis of transaminitis. This patient is at increased risk for a complicated clinical course relating to presenting COVID-19 infection on the basis of the following co-morbidities: Hypertension. Denies any known history of  diabetes or chronic heart failure. No known chronic underlying pulmonary pathology. Of note, procalcitonin found to be nonelevated, which conveys a high negative predictive value against the possibility of bacterial pneumonia in the setting of the highly inflammatory nature of COVID-19.   Plan: Airborne and contact precautions. Monitor continuous pulse oximetry. prn supplemental O2 to maintain O2 sats greater than or equal to 92%. Proning protocol initiated. monitor on telemetry. PRN albuterol inhaler. Scheduled Combivent inhaler ordered. PRN acetaminophen for fever. Start dexamethasone and remdesivir, as above. Check inflammatory markers (fibrinogen, d dimer or fibrin derivatives, crp, ferritin, LDH) in the morning. Add on serum magnesium level and check serum phosphorus level. Repeat CMP and CBC in the morning. Flutter valve and incentive spirometry been ordered.     #) Acute hypoxic respiratory distress: Diagnosis on the basis of presenting oxygen saturations in the high 80s on room air, which have subsequently improved to 95 to 96% on 2 L nasal cannula, in the context of no known baseline supplemental oxygen requirements. This appears to be solely on the basis of presenting severe COVID-19 pneumonia, as above, with presenting chest x-ray showing evidence of bilateral patchy airspace opacities consistent with COVID-19 pneumonia in the absence of any concomitant pleural effusion, edema, or pneumothorax. No clinical or radiographic evidence to suggest acute decompensated heart failure. ACS also appears less likely in the absence of any associated chest pain, while presenting EKG shows no evidence of acute ischemic process. Acute pulmonary ballismus in the differential, but appears less likely relative to the above, although will monitor for result of D-dimer as well as ongoing clinical trending of response to the above B19 pneumonia interventions.  Plan: Recommend management of severe COVID-19 pneumonia,  as above, including initiation of Decadron and remdesivir. Monitor on telemetry. Monitor continuous pulse oximetry. Check serum phosphorus level and add on serum magnesium level. Repeat CBC with differential in the morning. Follow for results of general inflammatory markers, including that of D-dimer.     #) Acute hypoosmolar hyponatremia: Presenting serum sodium noted to be 127 relative to most recent prior serum sodium 139 on 05/15/2020. This may be multifactorial in nature, with most likely contributing factors felt to the SIADH in the setting of presenting severe COVID-19 pneumonia, with associated bilateral patchy airspace opacities identified on chest x-ray vs additional contribution from dehydration in the setting of patient's report of recent decline in oral intake due to intermittent recent nausea in the absence of vomiting or diarrhea. At this time, the patient appears relatively euvolemic, and there is suspicion for component of SIADH, as above. Will refrain  from provision of additional IV fluids, with the exception of supplemental potassium, pending the results of random urine sodium and urine osmolality studies provide additional insight into the potential contributions from dehydration versus SIADH, subsequent management would differ depending upon these results.    Plan: monitor strict I's and O's and daily weights.  random urine sodium, urine osmolality. refrain from additional IVF's pending these results, as above. Check serum osmolality to confirm suspected hypoosmolar etiology. Repeat BMP in the morning. Check TSH.      #) Hypokalemia: Serum potassium found to be low 3.4.  Plan: Potassium chloride 40 mEq IV over 4 hours x 1 now. Add on serum magnesium level. Repeat BMP in the morning. Monitor on telemetry.     #) Essential hypertension: Outpatient antihypertensive regimen is limited to Norvasc. Blood pressures in the ED and normotensive. However, in the context of presenting  severe COVID-19 pneumonia, will hold this evening's Nordness to ensure stable overnight pressures.   Plan: Hold home Norvasc tonight, as above. Close monitoring of ensuing blood pressure via routine vital signs.     #) Hyperlipidemia: On rosuvastatin as an outpatient.  Plan: Continue home statin.    DVT prophylaxis: Lovenox 40 mg subcu daily Code Status: Full code Family Communication: none Disposition Plan: Per Rounding Team Consults called: none  Admission status: Inpatient; med telemetry.   Of note, this patient was added by me to the following Admit List/Treatment Team:  armcadmits      PLEASE NOTE THAT DRAGON DICTATION SOFTWARE WAS USED IN THE CONSTRUCTION OF THIS NOTE.   [facility] Pager [phone] From 12PM- 12AM  Otherwise, please contact night-coverage  [email or web address] Password [facility]  08/16/2020, 6:04 PM

## 2020-08-17 DIAGNOSIS — I10 Essential (primary) hypertension: Secondary | ICD-10-CM | POA: Diagnosis not present

## 2020-08-17 DIAGNOSIS — J9601 Acute respiratory failure with hypoxia: Secondary | ICD-10-CM | POA: Diagnosis not present

## 2020-08-17 DIAGNOSIS — J1282 Pneumonia due to coronavirus disease 2019: Secondary | ICD-10-CM

## 2020-08-17 DIAGNOSIS — U071 COVID-19: Secondary | ICD-10-CM | POA: Diagnosis not present

## 2020-08-17 LAB — COMPREHENSIVE METABOLIC PANEL
ALT: 41 U/L (ref 0–44)
AST: 67 U/L — ABNORMAL HIGH (ref 15–41)
Albumin: 3.1 g/dL — ABNORMAL LOW (ref 3.5–5.0)
Alkaline Phosphatase: 62 U/L (ref 38–126)
Anion gap: 9 (ref 5–15)
BUN: 20 mg/dL (ref 6–20)
CO2: 23 mmol/L (ref 22–32)
Calcium: 8 mg/dL — ABNORMAL LOW (ref 8.9–10.3)
Chloride: 103 mmol/L (ref 98–111)
Creatinine, Ser: 0.88 mg/dL (ref 0.61–1.24)
GFR, Estimated: >60 mL/min (ref >60)
Glucose, Bld: 149 mg/dL — ABNORMAL HIGH (ref 70–99)
Potassium: 3.9 mmol/L (ref 3.5–5.1)
Sodium: 135 mmol/L (ref 135–145)
Total Bilirubin: 0.7 mg/dL (ref 0.3–1.2)
Total Protein: 6.8 g/dL (ref 6.5–8.1)

## 2020-08-17 LAB — CBC WITH DIFFERENTIAL/PLATELET
Abs Immature Granulocytes: 0.03 10*3/uL (ref 0.00–0.07)
Basophils Absolute: 0 10*3/uL (ref 0.0–0.1)
Basophils Relative: 0 %
Eosinophils Absolute: 0 10*3/uL (ref 0.0–0.5)
Eosinophils Relative: 0 %
HCT: 38.9 % — ABNORMAL LOW (ref 39.0–52.0)
Hemoglobin: 13.7 g/dL (ref 13.0–17.0)
Immature Granulocytes: 1 %
Lymphocytes Relative: 19 %
Lymphs Abs: 0.7 10*3/uL (ref 0.7–4.0)
MCH: 30.1 pg (ref 26.0–34.0)
MCHC: 35.2 g/dL (ref 30.0–36.0)
MCV: 85.5 fL (ref 80.0–100.0)
Monocytes Absolute: 0.1 10*3/uL (ref 0.1–1.0)
Monocytes Relative: 3 %
Neutro Abs: 2.8 10*3/uL (ref 1.7–7.7)
Neutrophils Relative %: 77 %
Platelets: 230 10*3/uL (ref 150–400)
RBC: 4.55 MIL/uL (ref 4.22–5.81)
RDW: 12.8 % (ref 11.5–15.5)
WBC: 3.7 10*3/uL — ABNORMAL LOW (ref 4.0–10.5)
nRBC: 0 % (ref 0.0–0.2)

## 2020-08-17 LAB — D-DIMER, QUANTITATIVE
D-Dimer, Quant: 1.14 ug/mL-FEU — ABNORMAL HIGH (ref 0.00–0.50)
D-Dimer, Quant: 1.1 ug/mL-FEU — ABNORMAL HIGH (ref 0.00–0.50)

## 2020-08-17 LAB — PHOSPHORUS: Phosphorus: 3.7 mg/dL (ref 2.5–4.6)

## 2020-08-17 LAB — LACTATE DEHYDROGENASE: LDH: 385 U/L — ABNORMAL HIGH (ref 98–192)

## 2020-08-17 LAB — MAGNESIUM: Magnesium: 2.5 mg/dL — ABNORMAL HIGH (ref 1.7–2.4)

## 2020-08-17 LAB — C-REACTIVE PROTEIN: CRP: 16.6 mg/dL — ABNORMAL HIGH (ref <1.0)

## 2020-08-17 LAB — HIV ANTIBODY (ROUTINE TESTING W REFLEX): HIV Screen 4th Generation wRfx: NONREACTIVE

## 2020-08-17 LAB — FERRITIN: Ferritin: 1100 ng/mL — ABNORMAL HIGH (ref 24–336)

## 2020-08-17 LAB — OSMOLALITY, URINE: Osmolality, Ur: 291 mOsm/kg — ABNORMAL LOW (ref 300–900)

## 2020-08-17 LAB — PROCALCITONIN: Procalcitonin: 0.21 ng/mL

## 2020-08-17 LAB — FIBRINOGEN: Fibrinogen: 657 mg/dL — ABNORMAL HIGH (ref 210–475)

## 2020-08-17 LAB — OSMOLALITY: Osmolality: 276 mOsm/kg (ref 275–295)

## 2020-08-17 LAB — TSH: TSH: 0.508 u[IU]/mL (ref 0.350–4.500)

## 2020-08-17 LAB — SODIUM, URINE, RANDOM: Sodium, Ur: <10 mmol/L

## 2020-08-17 MED ORDER — BARICITINIB 2 MG PO TABS
4.0000 mg | ORAL_TABLET | Freq: Every day | ORAL | Status: DC
  Administered 2020-08-17 – 2020-08-28 (×12): 4 mg via ORAL
  Filled 2020-08-17 (×12): qty 2

## 2020-08-17 MED ORDER — METHYLPREDNISOLONE SODIUM SUCC 125 MG IJ SOLR
60.0000 mg | Freq: Two times a day (BID) | INTRAMUSCULAR | Status: AC
  Administered 2020-08-17 – 2020-08-26 (×20): 60 mg via INTRAVENOUS
  Filled 2020-08-17 (×20): qty 2

## 2020-08-17 NOTE — ED Notes (Signed)
Timothy Lang called, as per pt request and given update. Pt also talked with Timothy Lang.

## 2020-08-17 NOTE — ED Notes (Signed)
RN emptied bedside commode that pt used. noted to have some diarrhea and urine in the commode. Pt states stool is starting to become less watery.

## 2020-08-17 NOTE — Progress Notes (Signed)
PROGRESS NOTE  Timothy Lang YNW:[id number] DOB: 1966-04-18 DOA: 08/16/2020 PCP: Smitty Cords, DO   LOS: 1 day   Brief Narrative / Interim history: 55 year old male with HTN, HLD, who comes into the hospital with shortness of breath.  This has been going on for the past 5 to 6 days, also has been having nonproductive cough, intermittent nausea but no vomiting. He initially tested positive for Covid on 1/2.  Subjective / 24h Interval events: He is doing well this morning, still feels short of breath but overall about the same as yesterday.  Assessment & Plan:  Principal Problem Acute Hypoxic Respiratory Failure due to Covid-19 Viral Illness -He is on 4 L this morning, significant elevation in his CRP, patient started Remdesivir, steroids, added baricitinib after discussing risks/benefits with patient.      COVID-19 Labs  Recent Labs    08/16/20 1800 08/17/20 0556  DDIMER 1.14* 1.10*  FERRITIN 1,023* 1,100*  LDH 421* 385*  CRP 18.0* 16.6*    Lab Results  Component Value Date   SARSCOV2NAA POSITIVE (A) 08/13/2020    Active Problems Hypotonic hyponatremia -Likely in the setting of dehydration due to poor p.o. intake, sodium 127 on admission, received IV fluids and sodium has normalized this morning.  Hypokalemia -Monitor and replete  Hypertension -Hold home Norvasc, he is normotensive this morning.  Hyperlipidemia -continue statin  Scheduled Meds: . baricitinib  4 mg Oral Daily  . enoxaparin (LOVENOX) injection  50 mg Subcutaneous Q24H  . Ipratropium-Albuterol  1 puff Inhalation Q6H WA  . methylPREDNISolone (SOLU-MEDROL) injection  60 mg Intravenous Q12H  . rosuvastatin  10 mg Oral q1800   Continuous Infusions: . remdesivir 100 mg in NS 100 mL Stopped (08/17/20 1033)   PRN Meds:.acetaminophen **OR** acetaminophen, albuterol, benzonatate, ondansetron (ZOFRAN) IV  DVT prophylaxis: Lovenox Code Status: Full code Family Communication: d/w  patient   Status is: Inpatient  Remains inpatient appropriate because:Inpatient level of care appropriate due to severity of illness   Dispo: The patient is from: Home              Anticipated d/c is to: Home              Anticipated d/c date is: 3 days              Patient currently is not medically stable to d/c.   Consultants:  None   Procedures:  None   Microbiology: None   Antibacterials: None    Objective: Vitals:   08/17/20 0600 08/17/20 0622 08/17/20 0900 08/17/20 1200  BP: 116/75 116/75 105/72 113/67  Pulse: 65 65 70 68  Resp:  18 (!) 24 (!) 24  Temp:      TempSrc:      SpO2: 96% 96% 91% 91%  Weight:      Height:       No intake or output data in the 24 hours ending 08/17/20 1320 Filed Weights   08/16/20 1257  Weight: 102 kg    Examination:  Constitutional: NAD Eyes: no scleral icterus ENMT: Mucous membranes are moist.  Neck: normal, supple Respiratory: Bibasilar rhonchi, no wheezing, no crackles.  Increased respiratory effort.  Cardiovascular: Regular rate and rhythm, no murmurs / rubs / gallops. No LE edema.  Abdomen: non distended, no tenderness. Bowel sounds positive.  Musculoskeletal: no clubbing / cyanosis.  Skin: no rashes Neurologic: CN 2-12 grossly intact. Strength 5/5 in all 4.   Data Reviewed: I have independently reviewed following labs and imaging  studies   CBC: Recent Labs  Lab 08/16/20 1300 08/17/20 0556  WBC 5.2 3.7*  NEUTROABS 4.1 2.8  HGB 14.1 13.7  HCT 40.3 38.9*  MCV 84.5 85.5  PLT 206 401   Basic Metabolic Panel: Recent Labs  Lab 08/16/20 1300 08/16/20 1800 08/17/20 0556  NA 127*  --  135  K 3.4*  --  3.9  CL 93*  --  103  CO2 19*  --  23  GLUCOSE 108*  --  149*  BUN 16  --  20  CREATININE 0.98  --  0.88  CALCIUM 8.5*  --  8.0*  MG  --  2.3 2.5*  PHOS  --   --  3.7   GFR: Estimated Creatinine Clearance: 118.6 mL/min (by C-G formula based on SCr of 0.88 mg/dL). Liver Function Tests: Recent Labs   Lab 08/16/20 1300 08/17/20 0556  AST 55* 67*  ALT 34 41  ALKPHOS 69 62  BILITOT 0.9 0.7  PROT 7.7 6.8  ALBUMIN 3.7 3.1*   No results for input(s): LIPASE, AMYLASE in the last 168 hours. No results for input(s): AMMONIA in the last 168 hours. Coagulation Profile: No results for input(s): INR, PROTIME in the last 168 hours. Cardiac Enzymes: No results for input(s): CKTOTAL, CKMB, CKMBINDEX, TROPONINI in the last 168 hours. BNP (last 3 results) No results for input(s): PROBNP in the last 8760 hours. HbA1C: No results for input(s): HGBA1C in the last 72 hours. CBG: No results for input(s): GLUCAP in the last 168 hours. Lipid Profile: Recent Labs    08/16/20 1800  TRIG 101   Thyroid Function Tests: Recent Labs    08/17/20 0556  TSH 0.508   Anemia Panel: Recent Labs    08/16/20 1800 08/17/20 0556  FERRITIN 1,023* 1,100*   Urine analysis:    Component Value Date/Time   COLORURINE YELLOW (A) 08/16/2020 1259   APPEARANCEUR CLEAR (A) 08/16/2020 1259   LABSPEC 1.009 08/16/2020 1259   PHURINE 6.0 08/16/2020 1259   GLUCOSEU NEGATIVE 08/16/2020 1259   HGBUR SMALL (A) 08/16/2020 1259   BILIRUBINUR NEGATIVE 08/16/2020 1259   KETONESUR 5 (A) 08/16/2020 1259   PROTEINUR 100 (A) 08/16/2020 1259   NITRITE NEGATIVE 08/16/2020 1259   LEUKOCYTESUR NEGATIVE 08/16/2020 1259   Sepsis Labs: Invalid input(s): PROCALCITONIN, LACTICIDVEN  Recent Results (from the past 240 hour(s))  SARS CORONAVIRUS 2 (TAT 6-24 HRS) Nasopharyngeal Nasopharyngeal Swab     Status: Abnormal   Collection Time: 08/13/20  5:14 PM   Specimen: Nasopharyngeal Swab  Result Value Ref Range Status   SARS Coronavirus 2 POSITIVE (A) NEGATIVE Final    Comment: EMAILED MELISSA BROWNING 0272 08/14/2020 T. TYSOR (NOTE) SARS-CoV-2 target nucleic acids are DETECTED.  The SARS-CoV-2 RNA is generally detectable in upper and lower respiratory specimens during the acute phase of infection. Positive results are  indicative of the presence of SARS-CoV-2 RNA. Clinical correlation with patient history and other diagnostic information is  necessary to determine patient infection status. Positive results do not rule out bacterial infection or co-infection with other viruses.  The expected result is Negative.  Fact Sheet for Patients: SugarRoll.be  Fact Sheet for Healthcare Providers: https://www.woods-mathews.com/  This test is not yet approved or cleared by the [place] FDA and  has been authorized for detection and/or diagnosis of SARS-CoV-2 by FDA under an Emergency Use Authorization (EUA). This EUA will remain  in effect (meaning this test can be used) for the duration of the COVID-19 de claration under  Section 564(b)(1) of the Act, 21 U.S.C. section 360bbb-3(b)(1), unless the authorization is terminated or revoked sooner.   Performed at [facility], [address] [place], [place] [address]       Radiology Studies: DG Chest 2 View  Result Date: 08/16/2020 CLINICAL DATA:  55 year old male with history of shortness of breath. Recently diagnosed with COVID infection. EXAM: CHEST - 2 VIEW COMPARISON:  No priors. FINDINGS: Lung volumes are low. Patchy multifocal airspace disease and areas of interstitial prominence throughout the lungs bilaterally, most evident throughout the mid to lower lungs. No pneumothorax. No evidence of pulmonary edema. No pleural effusions. Heart size is normal. Upper mediastinal contours are within normal limits. IMPRESSION: 1. Severe multilobar bilateral pneumonia compatible with reported COVID infection, as above. Electronically Signed   By: Trudie Reed M.D.   On: 08/16/2020 13:42    Pamella Pert, MD, PhD [facility]  Between 7 am - 7 pm I am available, please contact me via Amion or Securechat  Between 7 pm - 7 am I am not available, please contact night coverage MD/APP via Amion

## 2020-08-17 NOTE — ED Notes (Signed)
This RN went into room. Pt states coming in due to covid. Pts oxygen noted to be at 6lpm via [place]. Pt states he was starting to fall asleep and the alarm went off. Oxygen saturation on monitor was 88-89%. When RN went into room, and pt was talking with RN,  Oxygen saturation went up to 93%

## 2020-08-17 NOTE — ED Notes (Signed)
Pt on 6L O2 via [place] - O2 sats while at rest 87% - pt called this nurse to room due to alarms sounding -- this nurse has now placed pt on 15L O2 via [place] with improvement to 95%; will notify provider and RT

## 2020-08-17 NOTE — Progress Notes (Signed)
PHARMACIST - PHYSICIAN COMMUNICATION  CONCERNING:  Enoxaparin (Lovenox) for DVT Prophylaxis    RECOMMENDATION: Patient was prescribed enoxaprin 40mg  q24 hours for VTE prophylaxis.   Filed Weights   08/16/20 1257  Weight: 102 kg (224 lb 13.9 oz)    Body mass index is 30.5 kg/m.  Estimated Creatinine Clearance: 106.5 mL/min (by C-G formula based on SCr of 0.98 mg/dL).  Based on [facility] policy patient is candidate for enoxaparin 0.5mg /kg TBW SQ every 24 hours based on BMI being >30.  DESCRIPTION: Pharmacy has adjusted enoxaparin dose per [facility] policy.  Patient is now receiving enoxaparin 50 mg every 24 hours    [facility], PharmD Clinical Pharmacist  08/17/2020 7:12 AM

## 2020-08-18 DIAGNOSIS — I10 Essential (primary) hypertension: Secondary | ICD-10-CM | POA: Diagnosis not present

## 2020-08-18 DIAGNOSIS — J1282 Pneumonia due to coronavirus disease 2019: Secondary | ICD-10-CM | POA: Diagnosis not present

## 2020-08-18 DIAGNOSIS — U071 COVID-19: Secondary | ICD-10-CM | POA: Diagnosis not present

## 2020-08-18 DIAGNOSIS — J9601 Acute respiratory failure with hypoxia: Secondary | ICD-10-CM | POA: Diagnosis not present

## 2020-08-18 LAB — CBC
HCT: 39 % (ref 39.0–52.0)
Hemoglobin: 13.6 g/dL (ref 13.0–17.0)
MCH: 30.2 pg (ref 26.0–34.0)
MCHC: 34.9 g/dL (ref 30.0–36.0)
MCV: 86.7 fL (ref 80.0–100.0)
Platelets: 299 10*3/uL (ref 150–400)
RBC: 4.5 MIL/uL (ref 4.22–5.81)
RDW: 13 % (ref 11.5–15.5)
WBC: 7.4 10*3/uL (ref 4.0–10.5)
nRBC: 0 % (ref 0.0–0.2)

## 2020-08-18 LAB — COMPREHENSIVE METABOLIC PANEL
ALT: 56 U/L — ABNORMAL HIGH (ref 0–44)
AST: 69 U/L — ABNORMAL HIGH (ref 15–41)
Albumin: 2.9 g/dL — ABNORMAL LOW (ref 3.5–5.0)
Alkaline Phosphatase: 60 U/L (ref 38–126)
Anion gap: 11 (ref 5–15)
BUN: 24 mg/dL — ABNORMAL HIGH (ref 6–20)
CO2: 20 mmol/L — ABNORMAL LOW (ref 22–32)
Calcium: 8.3 mg/dL — ABNORMAL LOW (ref 8.9–10.3)
Chloride: 107 mmol/L (ref 98–111)
Creatinine, Ser: 0.71 mg/dL (ref 0.61–1.24)
GFR, Estimated: >60 mL/min (ref >60)
Glucose, Bld: 150 mg/dL — ABNORMAL HIGH (ref 70–99)
Potassium: 4 mmol/L (ref 3.5–5.1)
Sodium: 138 mmol/L (ref 135–145)
Total Bilirubin: 0.7 mg/dL (ref 0.3–1.2)
Total Protein: 6.6 g/dL (ref 6.5–8.1)

## 2020-08-18 LAB — D-DIMER, QUANTITATIVE: D-Dimer, Quant: 1.07 ug/mL-FEU — ABNORMAL HIGH (ref 0.00–0.50)

## 2020-08-18 LAB — C-REACTIVE PROTEIN: CRP: 7.2 mg/dL — ABNORMAL HIGH (ref <1.0)

## 2020-08-18 MED ORDER — TRAZODONE HCL 50 MG PO TABS
50.0000 mg | ORAL_TABLET | Freq: Every evening | ORAL | Status: DC | PRN
  Administered 2020-08-18 – 2020-08-27 (×4): 50 mg via ORAL
  Filled 2020-08-18 (×4): qty 1

## 2020-08-18 NOTE — ED Notes (Signed)
Called RT for help to transport pt to floor. All RTs are busy at the moment, Herbert Seta, RT will come help transport pt when done with current assignment.

## 2020-08-18 NOTE — ED Notes (Signed)
Called RT. RT will come see pt and upgrade to higher oxygen flow rate. SPO2 currently 89%on 15L humidified high flow.

## 2020-08-18 NOTE — ED Notes (Signed)
This RN talked with provider, because on the 6LPM of oxygen, pt droopped to 85-89%. Provider stated to try pt on HFNC. Pt placed on HFNC at 10LPM. Pts current oxygen on HFNC at 10L is 88%. Rate increased to 12LPM (at 01:40) and pts oxygen saturation is at 88%. Rate increased to 15LPM at 0141. Oxygen saturation is 89-90%  Hospitalist notified

## 2020-08-18 NOTE — ED Notes (Signed)
Spoke with receptionist and informed them we will be bringing patient now

## 2020-08-18 NOTE — ED Notes (Signed)
Pt was complaining of the non-rebreather. Pt taken off non-rebreather and placed on 6lpm of oxygen via nasal canula.

## 2020-08-18 NOTE — ED Notes (Signed)
Darral Dash, RN called and informed that room is ready

## 2020-08-18 NOTE — ED Notes (Signed)
RT at bedside.

## 2020-08-18 NOTE — Progress Notes (Signed)
PROGRESS NOTE  Timothy Lang IRW:[id number] DOB: 1965/11/16 DOA: 08/16/2020 PCP: Smitty Cords, DO   LOS: 2 days   Brief Narrative / Interim history: 55 year old male with HTN, HLD, who comes into the hospital with shortness of breath.  This has been going on for the past 5 to 6 days, also has been having nonproductive cough, intermittent nausea but no vomiting. He initially tested positive for Covid on 1/2.  Subjective / 24h Interval events: Doing well given the circumstances.  Is still in the ED.  Has not been able to walk much.  Still feels short of breath.  Denies any chest pain, no abdominal pain, no nausea or vomiting  Assessment & Plan:  Principal Problem Acute Hypoxic Respiratory Failure due to Covid-19 Viral Illness -Currently on remdesivir, steroids, baricitinib -Continue supplemental oxygen to maintain sats above 88%, unfortunately his oxygenation got little bit worse now requiring 11 L.  Still significant inflammation -Prone as able, incentive spirometry, flutter valve    COVID-19 Labs  Recent Labs    08/16/20 1800 08/17/20 0556  DDIMER 1.14* 1.10*  FERRITIN 1,023* 1,100*  LDH 421* 385*  CRP 18.0* 16.6*    Lab Results  Component Value Date   SARSCOV2NAA POSITIVE (A) 08/13/2020    Active Problems Hypotonic hyponatremia -Likely in the setting of dehydration due to poor p.o. intake, sodium normalized with fluids.  Hold further fluids.  Hypokalemia -Monitor and replete  Hypertension -Hold home Norvasc, normotensive today  Elevated LFTs -Likely due to Covid, monitor  Hyperlipidemia -continue statin  Scheduled Meds: . baricitinib  4 mg Oral Daily  . enoxaparin (LOVENOX) injection  50 mg Subcutaneous Q24H  . Ipratropium-Albuterol  1 puff Inhalation Q6H WA  . methylPREDNISolone (SOLU-MEDROL) injection  60 mg Intravenous Q12H  . rosuvastatin  10 mg Oral q1800   Continuous Infusions: . remdesivir 100 mg in NS 100 mL 100 mg (08/18/20 1053)    PRN Meds:.acetaminophen **OR** acetaminophen, albuterol, benzonatate, ondansetron (ZOFRAN) IV  DVT prophylaxis: Lovenox Code Status: Full code Family Communication: d/w patient, will update wife in the afternoon  Status is: Inpatient  Remains inpatient appropriate because:Inpatient level of care appropriate due to severity of illness   Dispo: The patient is from: Home              Anticipated d/c is to: Home              Anticipated d/c date is: 3 days              Patient currently is not medically stable to d/c.   Consultants:  None   Procedures:  None   Microbiology: None   Antibacterials: None    Objective: Vitals:   08/17/20 2300 08/18/20 0118 08/18/20 0300 08/18/20 1034  BP:   119/76   Pulse: 67 72 66   Resp:  (!) 25 18   Temp:      TempSrc:      SpO2: 91% 91% 92% 90%  Weight:      Height:       No intake or output data in the 24 hours ending 08/18/20 1115 Filed Weights   08/16/20 1257  Weight: 102 kg    Examination:  Constitutional: No distress Eyes: No icterus ENMT: mmm Neck: normal, supple Respiratory: Bibasilar rhonchi, no wheezing, increased respiratory effort Cardiovascular: Regular rate and rhythm, no murmurs, no peripheral edema Abdomen: Soft, NT, ND, bowel sounds positive Musculoskeletal: no clubbing / cyanosis.  Skin: No rashes seen Neurologic: Nonfocal,  equal strength   Data Reviewed: I have independently reviewed following labs and imaging studies   CBC: Recent Labs  Lab 08/16/20 1300 08/17/20 0556 08/18/20 0548  WBC 5.2 3.7* 7.4  NEUTROABS 4.1 2.8  --   HGB 14.1 13.7 13.6  HCT 40.3 38.9* 39.0  MCV 84.5 85.5 86.7  PLT 206 230 299   Basic Metabolic Panel: Recent Labs  Lab 08/16/20 1300 08/16/20 1800 08/17/20 0556 08/18/20 0548  NA 127*  --  135 138  K 3.4*  --  3.9 4.0  CL 93*  --  103 107  CO2 19*  --  23 20*  GLUCOSE 108*  --  149* 150*  BUN 16  --  20 24*  CREATININE 0.98  --  0.88 0.71  CALCIUM 8.5*  --   8.0* 8.3*  MG  --  2.3 2.5*  --   PHOS  --   --  3.7  --    GFR: Estimated Creatinine Clearance: 130.5 mL/min (by C-G formula based on SCr of 0.71 mg/dL). Liver Function Tests: Recent Labs  Lab 08/16/20 1300 08/17/20 0556 08/18/20 0548  AST 55* 67* 69*  ALT 34 41 56*  ALKPHOS 69 62 60  BILITOT 0.9 0.7 0.7  PROT 7.7 6.8 6.6  ALBUMIN 3.7 3.1* 2.9*   No results for input(s): LIPASE, AMYLASE in the last 168 hours. No results for input(s): AMMONIA in the last 168 hours. Coagulation Profile: No results for input(s): INR, PROTIME in the last 168 hours. Cardiac Enzymes: No results for input(s): CKTOTAL, CKMB, CKMBINDEX, TROPONINI in the last 168 hours. BNP (last 3 results) No results for input(s): PROBNP in the last 8760 hours. HbA1C: No results for input(s): HGBA1C in the last 72 hours. CBG: No results for input(s): GLUCAP in the last 168 hours. Lipid Profile: Recent Labs    08/16/20 1800  TRIG 101   Thyroid Function Tests: Recent Labs    08/17/20 0556  TSH 0.508   Anemia Panel: Recent Labs    08/16/20 1800 08/17/20 0556  FERRITIN 1,023* 1,100*   Urine analysis:    Component Value Date/Time   COLORURINE YELLOW (A) 08/16/2020 1259   APPEARANCEUR CLEAR (A) 08/16/2020 1259   LABSPEC 1.009 08/16/2020 1259   PHURINE 6.0 08/16/2020 1259   GLUCOSEU NEGATIVE 08/16/2020 1259   HGBUR SMALL (A) 08/16/2020 1259   BILIRUBINUR NEGATIVE 08/16/2020 1259   KETONESUR 5 (A) 08/16/2020 1259   PROTEINUR 100 (A) 08/16/2020 1259   NITRITE NEGATIVE 08/16/2020 1259   LEUKOCYTESUR NEGATIVE 08/16/2020 1259   Sepsis Labs: Invalid input(s): PROCALCITONIN, LACTICIDVEN  Recent Results (from the past 240 hour(s))  SARS CORONAVIRUS 2 (TAT 6-24 HRS) Nasopharyngeal Nasopharyngeal Swab     Status: Abnormal   Collection Time: 08/13/20  5:14 PM   Specimen: Nasopharyngeal Swab  Result Value Ref Range Status   SARS Coronavirus 2 POSITIVE (A) NEGATIVE Final    Comment: EMAILED MELISSA  BROWNING 0541 08/14/2020 T. TYSOR (NOTE) SARS-CoV-2 target nucleic acids are DETECTED.  The SARS-CoV-2 RNA is generally detectable in upper and lower respiratory specimens during the acute phase of infection. Positive results are indicative of the presence of SARS-CoV-2 RNA. Clinical correlation with patient history and other diagnostic information is  necessary to determine patient infection status. Positive results do not rule out bacterial infection or co-infection with other viruses.  The expected result is Negative.  Fact Sheet for Patients: HairSlick.no  Fact Sheet for Healthcare Providers: quierodirigir.com  This test is not yet approved  or cleared by the [place] and  has been authorized for detection and/or diagnosis of SARS-CoV-2 by FDA under an Emergency Use Authorization (EUA). This EUA will remain  in effect (meaning this test can be used) for the duration of the COVID-19 de claration under Section 564(b)(1) of the Act, 21 U.S.C. section 360bbb-3(b)(1), unless the authorization is terminated or revoked sooner.   Performed at [facility], [place] [address] [place], [place] [address]       Radiology Studies: DG Chest 2 View  Result Date: 08/16/2020 CLINICAL DATA:  55 year old male with history of shortness of breath. Recently diagnosed with COVID infection. EXAM: CHEST - 2 VIEW COMPARISON:  No priors. FINDINGS: Lung volumes are low. Patchy multifocal airspace disease and areas of interstitial prominence throughout the lungs bilaterally, most evident throughout the mid to lower lungs. No pneumothorax. No evidence of pulmonary edema. No pleural effusions. Heart size is normal. Upper mediastinal contours are within normal limits. IMPRESSION: 1. Severe multilobar bilateral pneumonia compatible with reported COVID infection, as above. Electronically Signed   By: Vinnie Langton M.D.   On: 08/16/2020 13:42     Marzetta Board, MD, PhD [facility]  Between 7 am - 7 pm I am available, please contact me via Amion or Securechat  Between 7 pm - 7 am I am not available, please contact night coverage MD/APP via Amion

## 2020-08-18 NOTE — ED Notes (Signed)
Patient ambulated to bedside commode in room

## 2020-08-18 NOTE — ED Notes (Signed)
Called RT. This pt accordign to RT is on heated HF [place]. Turned rate up to 15L from 10L. Pt satting 90-91% up from 88%. Pt alert and oriented times 4.

## 2020-08-18 NOTE — ED Notes (Signed)
Patient lying in bed and appears to be asleep, NAD observed and will continue to monitor 

## 2020-08-19 DIAGNOSIS — J9601 Acute respiratory failure with hypoxia: Secondary | ICD-10-CM | POA: Diagnosis not present

## 2020-08-19 DIAGNOSIS — J1282 Pneumonia due to coronavirus disease 2019: Secondary | ICD-10-CM | POA: Diagnosis not present

## 2020-08-19 DIAGNOSIS — U071 COVID-19: Secondary | ICD-10-CM | POA: Diagnosis not present

## 2020-08-19 DIAGNOSIS — I10 Essential (primary) hypertension: Secondary | ICD-10-CM | POA: Diagnosis not present

## 2020-08-19 LAB — COMPREHENSIVE METABOLIC PANEL
ALT: 101 U/L — ABNORMAL HIGH (ref 0–44)
AST: 104 U/L — ABNORMAL HIGH (ref 15–41)
Albumin: 2.9 g/dL — ABNORMAL LOW (ref 3.5–5.0)
Alkaline Phosphatase: 62 U/L (ref 38–126)
Anion gap: 12 (ref 5–15)
BUN: 25 mg/dL — ABNORMAL HIGH (ref 6–20)
CO2: 21 mmol/L — ABNORMAL LOW (ref 22–32)
Calcium: 8.4 mg/dL — ABNORMAL LOW (ref 8.9–10.3)
Chloride: 106 mmol/L (ref 98–111)
Creatinine, Ser: 0.79 mg/dL (ref 0.61–1.24)
GFR, Estimated: >60 mL/min (ref >60)
Glucose, Bld: 157 mg/dL — ABNORMAL HIGH (ref 70–99)
Potassium: 4.2 mmol/L (ref 3.5–5.1)
Sodium: 139 mmol/L (ref 135–145)
Total Bilirubin: 0.7 mg/dL (ref 0.3–1.2)
Total Protein: 6.4 g/dL — ABNORMAL LOW (ref 6.5–8.1)

## 2020-08-19 LAB — CBC
HCT: 40.2 % (ref 39.0–52.0)
Hemoglobin: 13.9 g/dL (ref 13.0–17.0)
MCH: 29.9 pg (ref 26.0–34.0)
MCHC: 34.6 g/dL (ref 30.0–36.0)
MCV: 86.5 fL (ref 80.0–100.0)
Platelets: 341 10*3/uL (ref 150–400)
RBC: 4.65 MIL/uL (ref 4.22–5.81)
RDW: 13.1 % (ref 11.5–15.5)
WBC: 6.2 10*3/uL (ref 4.0–10.5)
nRBC: 0 % (ref 0.0–0.2)

## 2020-08-19 LAB — C-REACTIVE PROTEIN: CRP: 3 mg/dL — ABNORMAL HIGH (ref <1.0)

## 2020-08-19 LAB — MAGNESIUM: Magnesium: 2.6 mg/dL — ABNORMAL HIGH (ref 1.7–2.4)

## 2020-08-19 LAB — D-DIMER, QUANTITATIVE: D-Dimer, Quant: 0.58 ug/mL-FEU — ABNORMAL HIGH (ref 0.00–0.50)

## 2020-08-19 MED ORDER — ENOXAPARIN SODIUM 40 MG/0.4ML ~~LOC~~ SOLN
40.0000 mg | SUBCUTANEOUS | Status: DC
  Administered 2020-08-20 – 2020-08-28 (×9): 40 mg via SUBCUTANEOUS
  Filled 2020-08-19 (×9): qty 0.4

## 2020-08-19 NOTE — Progress Notes (Signed)
PROGRESS NOTE  Timothy Lang FYB:[id number] DOB: [birth year] DOA: 08/16/2020 PCP: Smitty Cords, DO   LOS: 3 days   Brief Narrative / Interim history: 55 year old male with HTN, HLD, who comes into the hospital with shortness of breath.  This has been going on for the past 5 to 6 days, also has been having nonproductive cough, intermittent nausea but no vomiting. He initially tested positive for Covid on 1/2.  Subjective / 24h Interval events: Subjectively states that he is improved. Has worsening oxygen requirements this morning on heated high flow 50 L 65% FiO2. No chest pain, no shortness of breath  Assessment & Plan:  Principal Problem Acute Hypoxic Respiratory Failure due to Covid-19 Viral Illness -Currently on remdesivir, steroids, baricitinib -Continue supplemental oxygen to maintain sats above 88%, oxygenation got a little bit worse yesterday, but he feels better and will attempt to wean back down the oxygen -Incentive spirometry, flutter valve, prone as able. CRP improving    COVID-19 Labs  Recent Labs    08/16/20 1800 08/17/20 0556 08/18/20 0548  DDIMER 1.14* 1.10* 1.07*  FERRITIN 1,023* 1,100*  --   LDH 421* 385*  --   CRP 18.0* 16.6* 7.2*    Lab Results  Component Value Date   SARSCOV2NAA POSITIVE (A) 08/13/2020    Active Problems Hypotonic hyponatremia -Likely in the setting of dehydration due to poor p.o. intake, sodium normalized with fluids.  Hold further fluids.  Hypokalemia -Monitor and replete  Hypertension -Hold home Norvasc, normotensive today  Elevated LFTs -Likely due to Covid, monitor  Hyperlipidemia -continue statin  Scheduled Meds: . baricitinib  4 mg Oral Daily  . enoxaparin (LOVENOX) injection  50 mg Subcutaneous Q24H  . Ipratropium-Albuterol  1 puff Inhalation Q6H WA  . methylPREDNISolone (SOLU-MEDROL) injection  60 mg Intravenous Q12H  . rosuvastatin  10 mg Oral q1800   Continuous Infusions: . remdesivir 100 mg in  NS 100 mL 100 mg (08/19/20 0847)   PRN Meds:.acetaminophen **OR** acetaminophen, albuterol, benzonatate, ondansetron (ZOFRAN) IV, traZODone  DVT prophylaxis: Lovenox Code Status: Full code Family Communication: d/w patient, will update wife in the afternoon  Status is: Inpatient  Remains inpatient appropriate because:Inpatient level of care appropriate due to severity of illness  Dispo: The patient is from: Home              Anticipated d/c is to: Home              Anticipated d/c date is: 3 days              Patient currently is not medically stable to d/c.   Consultants:  None   Procedures:  None   Microbiology: None   Antibacterials: None    Objective: Vitals:   08/18/20 1947 08/18/20 2033 08/19/20 0511 08/19/20 0750  BP: 116/81  114/75 114/68  Pulse: 70  62 (!) 59  Resp: 18  18 (!) 21  Temp: 98 F (36.7 C)  98.6 F (37 C) 98.7 F (37.1 C)  TempSrc: Oral  Oral Oral  SpO2: 93% 95% 92% 90%  Weight:   98.3 kg   Height:        Intake/Output Summary (Last 24 hours) at 08/19/2020 1057 Last data filed at 08/18/2020 1151 Gross per 24 hour  Intake 100 ml  Output -  Net 100 ml   Filed Weights   08/16/20 1257 08/18/20 1700 08/19/20 0511  Weight: 102 kg 96.3 kg 98.3 kg    Examination:  Constitutional: No  distress, in bed Eyes: No icterus ENMT: Moist mucous membranes Neck: normal, supple Respiratory: Bibasilar rhonchi, no wheezing Cardiovascular: Regular rate and rhythm, no murmurs, no edema Abdomen: Soft, nontender, nondistended, bowel sounds positive Musculoskeletal: no clubbing / cyanosis.  Skin: No rashes seen Neurologic: No focal deficits   Data Reviewed: I have independently reviewed following labs and imaging studies   CBC: Recent Labs  Lab 08/16/20 1300 08/17/20 0556 08/18/20 0548 08/19/20 0526  WBC 5.2 3.7* 7.4 6.2  NEUTROABS 4.1 2.8  --   --   HGB 14.1 13.7 13.6 13.9  HCT 40.3 38.9* 39.0 40.2  MCV 84.5 85.5 86.7 86.5  PLT 206 230 299  341   Basic Metabolic Panel: Recent Labs  Lab 08/16/20 1300 08/16/20 1800 08/17/20 0556 08/18/20 0548 08/19/20 0526  NA 127*  --  135 138 139  K 3.4*  --  3.9 4.0 4.2  CL 93*  --  103 107 106  CO2 19*  --  23 20* 21*  GLUCOSE 108*  --  149* 150* 157*  BUN 16  --  20 24* 25*  CREATININE 0.98  --  0.88 0.71 0.79  CALCIUM 8.5*  --  8.0* 8.3* 8.4*  MG  --  2.3 2.5*  --  2.6*  PHOS  --   --  3.7  --   --    GFR: Estimated Creatinine Clearance: 128.3 mL/min (by C-G formula based on SCr of 0.79 mg/dL). Liver Function Tests: Recent Labs  Lab 08/16/20 1300 08/17/20 0556 08/18/20 0548 08/19/20 0526  AST 55* 67* 69* 104*  ALT 34 41 56* 101*  ALKPHOS 69 62 60 62  BILITOT 0.9 0.7 0.7 0.7  PROT 7.7 6.8 6.6 6.4*  ALBUMIN 3.7 3.1* 2.9* 2.9*   No results for input(s): LIPASE, AMYLASE in the last 168 hours. No results for input(s): AMMONIA in the last 168 hours. Coagulation Profile: No results for input(s): INR, PROTIME in the last 168 hours. Cardiac Enzymes: No results for input(s): CKTOTAL, CKMB, CKMBINDEX, TROPONINI in the last 168 hours. BNP (last 3 results) No results for input(s): PROBNP in the last 8760 hours. HbA1C: No results for input(s): HGBA1C in the last 72 hours. CBG: No results for input(s): GLUCAP in the last 168 hours. Lipid Profile: Recent Labs    08/16/20 1800  TRIG 101   Thyroid Function Tests: Recent Labs    08/17/20 0556  TSH 0.508   Anemia Panel: Recent Labs    08/16/20 1800 08/17/20 0556  FERRITIN 1,023* 1,100*   Urine analysis:    Component Value Date/Time   COLORURINE YELLOW (A) 08/16/2020 1259   APPEARANCEUR CLEAR (A) 08/16/2020 1259   LABSPEC 1.009 08/16/2020 1259   PHURINE 6.0 08/16/2020 1259   GLUCOSEU NEGATIVE 08/16/2020 1259   HGBUR SMALL (A) 08/16/2020 1259   BILIRUBINUR NEGATIVE 08/16/2020 1259   KETONESUR 5 (A) 08/16/2020 1259   PROTEINUR 100 (A) 08/16/2020 1259   NITRITE NEGATIVE 08/16/2020 1259   LEUKOCYTESUR NEGATIVE  08/16/2020 1259   Sepsis Labs: Invalid input(s): PROCALCITONIN, LACTICIDVEN  Recent Results (from the past 240 hour(s))  SARS CORONAVIRUS 2 (TAT 6-24 HRS) Nasopharyngeal Nasopharyngeal Swab     Status: Abnormal   Collection Time: 08/13/20  5:14 PM   Specimen: Nasopharyngeal Swab  Result Value Ref Range Status   SARS Coronavirus 2 POSITIVE (A) NEGATIVE Final    Comment: EMAILED MELISSA BROWNING 0541 08/14/2020 T. TYSOR (NOTE) SARS-CoV-2 target nucleic acids are DETECTED.  The SARS-CoV-2 RNA is generally detectable  in upper and lower respiratory specimens during the acute phase of infection. Positive results are indicative of the presence of SARS-CoV-2 RNA. Clinical correlation with patient history and other diagnostic information is  necessary to determine patient infection status. Positive results do not rule out bacterial infection or co-infection with other viruses.  The expected result is Negative.  Fact Sheet for Patients: HairSlick.no  Fact Sheet for Healthcare Providers: quierodirigir.com  This test is not yet approved or cleared by the [place] FDA and  has been authorized for detection and/or diagnosis of SARS-CoV-2 by FDA under an Emergency Use Authorization (EUA). This EUA will remain  in effect (meaning this test can be used) for the duration of the COVID-19 de claration under Section 564(b)(1) of the Act, 21 U.S.C. section 360bbb-3(b)(1), unless the authorization is terminated or revoked sooner.   Performed at [facility], [address], [place], [place] [address]       Radiology Studies: No results found.  Pamella Pert, MD, PhD [facility]  Between 7 am - 7 pm I am available, please contact me via Amion or Securechat  Between 7 pm - 7 am I am not available, please contact night coverage MD/APP via Amion

## 2020-08-19 NOTE — Plan of Care (Signed)
  Problem: Education: Goal: Knowledge of risk factors and measures for prevention of condition will improve Outcome: Progressing   Problem: Respiratory: Goal: Will maintain a patent airway Outcome: Progressing   

## 2020-08-20 DIAGNOSIS — U071 COVID-19: Secondary | ICD-10-CM | POA: Diagnosis not present

## 2020-08-20 DIAGNOSIS — I10 Essential (primary) hypertension: Secondary | ICD-10-CM | POA: Diagnosis not present

## 2020-08-20 DIAGNOSIS — J1282 Pneumonia due to coronavirus disease 2019: Secondary | ICD-10-CM | POA: Diagnosis not present

## 2020-08-20 DIAGNOSIS — J9601 Acute respiratory failure with hypoxia: Secondary | ICD-10-CM | POA: Diagnosis not present

## 2020-08-20 LAB — D-DIMER, QUANTITATIVE: D-Dimer, Quant: 0.98 ug/mL-FEU — ABNORMAL HIGH (ref 0.00–0.50)

## 2020-08-20 LAB — COMPREHENSIVE METABOLIC PANEL
ALT: 160 U/L — ABNORMAL HIGH (ref 0–44)
AST: 117 U/L — ABNORMAL HIGH (ref 15–41)
Albumin: 2.8 g/dL — ABNORMAL LOW (ref 3.5–5.0)
Alkaline Phosphatase: 63 U/L (ref 38–126)
Anion gap: 11 (ref 5–15)
BUN: 24 mg/dL — ABNORMAL HIGH (ref 6–20)
CO2: 21 mmol/L — ABNORMAL LOW (ref 22–32)
Calcium: 8.3 mg/dL — ABNORMAL LOW (ref 8.9–10.3)
Chloride: 106 mmol/L (ref 98–111)
Creatinine, Ser: 0.76 mg/dL (ref 0.61–1.24)
GFR, Estimated: >60 mL/min (ref >60)
Glucose, Bld: 165 mg/dL — ABNORMAL HIGH (ref 70–99)
Potassium: 4.2 mmol/L (ref 3.5–5.1)
Sodium: 138 mmol/L (ref 135–145)
Total Bilirubin: 0.9 mg/dL (ref 0.3–1.2)
Total Protein: 6.3 g/dL — ABNORMAL LOW (ref 6.5–8.1)

## 2020-08-20 LAB — C-REACTIVE PROTEIN: CRP: 1.4 mg/dL — ABNORMAL HIGH (ref <1.0)

## 2020-08-20 LAB — CBC
HCT: 40.4 % (ref 39.0–52.0)
Hemoglobin: 13.9 g/dL (ref 13.0–17.0)
MCH: 30.1 pg (ref 26.0–34.0)
MCHC: 34.4 g/dL (ref 30.0–36.0)
MCV: 87.4 fL (ref 80.0–100.0)
Platelets: 382 10*3/uL (ref 150–400)
RBC: 4.62 MIL/uL (ref 4.22–5.81)
RDW: 13.1 % (ref 11.5–15.5)
WBC: 6.8 10*3/uL (ref 4.0–10.5)
nRBC: 0 % (ref 0.0–0.2)

## 2020-08-20 MED ORDER — PANTOPRAZOLE SODIUM 40 MG PO TBEC
40.0000 mg | DELAYED_RELEASE_TABLET | Freq: Every day | ORAL | Status: DC
  Administered 2020-08-20 – 2020-08-28 (×9): 40 mg via ORAL
  Filled 2020-08-20 (×9): qty 1

## 2020-08-20 MED ORDER — OXYMETAZOLINE HCL 0.05 % NA SOLN
1.0000 | Freq: Two times a day (BID) | NASAL | Status: DC
  Administered 2020-08-20 – 2020-08-24 (×7): 1 via NASAL
  Filled 2020-08-20: qty 15

## 2020-08-20 NOTE — Progress Notes (Signed)
PROGRESS NOTE  Timothy Lang SFK:[id number] DOB: 29-Aug-1965 DOA: 08/16/2020 PCP: Smitty Cords, DO   LOS: 4 days   Brief Narrative / Interim history: 55 year old male with HTN, HLD, who comes into the hospital with shortness of breath.  This has been going on for the past 5 to 6 days, also has been having nonproductive cough, intermittent nausea but no vomiting. He initially tested positive for Covid on 1/2.  Subjective / 24h Interval events: Subjectively improving.  On 15 L this morning.  Complains of stuffy nose  Assessment & Plan:  Principal Problem Acute Hypoxic Respiratory Failure due to Covid-19 Viral Illness -Currently on remdesivir, steroids, baricitinib, continue -Continue supplemental oxygen to maintain sats above 88%, currently on 15 L, wean off as tolerated -Incentive spirometry, flutter valve, prone as able. CRP improving, clinically improving    COVID-19 Labs  Recent Labs    08/18/20 0548 08/19/20 0526 08/20/20 0459  DDIMER 1.07* 0.58* 0.98*  CRP 7.2* 3.0*  --     Lab Results  Component Value Date   SARSCOV2NAA POSITIVE (A) 08/13/2020    Active Problems Hypotonic hyponatremia -Likely in the setting of dehydration due to poor p.o. intake, sodium normal this morning  Hypokalemia -Monitor and replete, potassium normalized  Hypertension -Hold home Norvasc, normotensive today  Elevated LFTs -Trending upwards, closely monitor.  No GI symptoms.  Likely due to Covid/Remdesivir  Hyperlipidemia -Hold statin due to elevation in LFTs  Scheduled Meds: . baricitinib  4 mg Oral Daily  . enoxaparin (LOVENOX) injection  40 mg Subcutaneous Q24H  . Ipratropium-Albuterol  1 puff Inhalation Q6H WA  . methylPREDNISolone (SOLU-MEDROL) injection  60 mg Intravenous Q12H  . oxymetazoline  1 spray Each Nare BID  . pantoprazole  40 mg Oral Daily  . rosuvastatin  10 mg Oral q1800   Continuous Infusions: . remdesivir 100 mg in NS 100 mL 100 mg (08/19/20 0847)    PRN Meds:.acetaminophen **OR** acetaminophen, albuterol, benzonatate, ondansetron (ZOFRAN) IV, traZODone  DVT prophylaxis: Lovenox Code Status: Full code Family Communication: d/w patient, will update wife in the afternoon  Status is: Inpatient  Remains inpatient appropriate because:Inpatient level of care appropriate due to severity of illness  Dispo: The patient is from: Home              Anticipated d/c is to: Home              Anticipated d/c date is: 3 days              Patient currently is not medically stable to d/c.   Consultants:  None   Procedures:  None   Microbiology: None   Antibacterials: None    Objective: Vitals:   08/20/20 0250 08/20/20 0400 08/20/20 0413 08/20/20 0901  BP:  117/87  127/81  Pulse:  67  64  Resp:    (!) 22  Temp:  97.9 F (36.6 C)  97.9 F (36.6 C)  TempSrc:  Oral  Oral  SpO2: 91% 90%  (!) 88%  Weight:   95 kg   Height:       No intake or output data in the 24 hours ending 08/20/20 1024 Filed Weights   08/18/20 1700 08/19/20 0511 08/20/20 0413  Weight: 96.3 kg 98.3 kg 95 kg    Examination:  Constitutional: No distress, in bed Eyes: No scleral icterus ENMT: Moist mucous membranes Neck: normal, supple Respiratory: Bibasilar rhonchi, tachypneic no wheezing Cardiovascular: Regular rate and rhythm, no murmurs, no peripheral edema  Abdomen: Soft, nontender, nondistended, bowel sounds positive Musculoskeletal: no clubbing / cyanosis.  Skin: No rashes seen Neurologic: Nonfocal, equal strength   Data Reviewed: I have independently reviewed following labs and imaging studies   CBC: Recent Labs  Lab 08/16/20 1300 08/17/20 0556 08/18/20 0548 08/19/20 0526 08/20/20 0459  WBC 5.2 3.7* 7.4 6.2 6.8  NEUTROABS 4.1 2.8  --   --   --   HGB 14.1 13.7 13.6 13.9 13.9  HCT 40.3 38.9* 39.0 40.2 40.4  MCV 84.5 85.5 86.7 86.5 87.4  PLT 206 230 299 341 382   Basic Metabolic Panel: Recent Labs  Lab 08/16/20 1300 08/16/20 1800  08/17/20 0556 08/18/20 0548 08/19/20 0526 08/20/20 0459  NA 127*  --  135 138 139 138  K 3.4*  --  3.9 4.0 4.2 4.2  CL 93*  --  103 107 106 106  CO2 19*  --  23 20* 21* 21*  GLUCOSE 108*  --  149* 150* 157* 165*  BUN 16  --  20 24* 25* 24*  CREATININE 0.98  --  0.88 0.71 0.79 0.76  CALCIUM 8.5*  --  8.0* 8.3* 8.4* 8.3*  MG  --  2.3 2.5*  --  2.6*  --   PHOS  --   --  3.7  --   --   --    GFR: Estimated Creatinine Clearance: 126.3 mL/min (by C-G formula based on SCr of 0.76 mg/dL). Liver Function Tests: Recent Labs  Lab 08/16/20 1300 08/17/20 0556 08/18/20 0548 08/19/20 0526 08/20/20 0459  AST 55* 67* 69* 104* 117*  ALT 34 41 56* 101* 160*  ALKPHOS 69 62 60 62 63  BILITOT 0.9 0.7 0.7 0.7 0.9  PROT 7.7 6.8 6.6 6.4* 6.3*  ALBUMIN 3.7 3.1* 2.9* 2.9* 2.8*   No results for input(s): LIPASE, AMYLASE in the last 168 hours. No results for input(s): AMMONIA in the last 168 hours. Coagulation Profile: No results for input(s): INR, PROTIME in the last 168 hours. Cardiac Enzymes: No results for input(s): CKTOTAL, CKMB, CKMBINDEX, TROPONINI in the last 168 hours. BNP (last 3 results) No results for input(s): PROBNP in the last 8760 hours. HbA1C: No results for input(s): HGBA1C in the last 72 hours. CBG: No results for input(s): GLUCAP in the last 168 hours. Lipid Profile: No results for input(s): CHOL, HDL, LDLCALC, TRIG, CHOLHDL, LDLDIRECT in the last 72 hours. Thyroid Function Tests: No results for input(s): TSH, T4TOTAL, FREET4, T3FREE, THYROIDAB in the last 72 hours. Anemia Panel: No results for input(s): VITAMINB12, FOLATE, FERRITIN, TIBC, IRON, RETICCTPCT in the last 72 hours. Urine analysis:    Component Value Date/Time   COLORURINE YELLOW (A) 08/16/2020 1259   APPEARANCEUR CLEAR (A) 08/16/2020 1259   LABSPEC 1.009 08/16/2020 1259   PHURINE 6.0 08/16/2020 1259   GLUCOSEU NEGATIVE 08/16/2020 1259   HGBUR SMALL (A) 08/16/2020 1259   BILIRUBINUR NEGATIVE 08/16/2020  1259   KETONESUR 5 (A) 08/16/2020 1259   PROTEINUR 100 (A) 08/16/2020 1259   NITRITE NEGATIVE 08/16/2020 1259   LEUKOCYTESUR NEGATIVE 08/16/2020 1259   Sepsis Labs: Invalid input(s): PROCALCITONIN, LACTICIDVEN  Recent Results (from the past 240 hour(s))  SARS CORONAVIRUS 2 (TAT 6-24 HRS) Nasopharyngeal Nasopharyngeal Swab     Status: Abnormal   Collection Time: 08/13/20  5:14 PM   Specimen: Nasopharyngeal Swab  Result Value Ref Range Status   SARS Coronavirus 2 POSITIVE (A) NEGATIVE Final    Comment: EMAILED MELISSA BROWNING 0541 08/14/2020 T. TYSOR (NOTE) SARS-CoV-2  target nucleic acids are DETECTED.  The SARS-CoV-2 RNA is generally detectable in upper and lower respiratory specimens during the acute phase of infection. Positive results are indicative of the presence of SARS-CoV-2 RNA. Clinical correlation with patient history and other diagnostic information is  necessary to determine patient infection status. Positive results do not rule out bacterial infection or co-infection with other viruses.  The expected result is Negative.  Fact Sheet for Patients: HairSlick.no  Fact Sheet for Healthcare Providers: quierodirigir.com  This test is not yet approved or cleared by the [place] FDA and  has been authorized for detection and/or diagnosis of SARS-CoV-2 by FDA under an Emergency Use Authorization (EUA). This EUA will remain  in effect (meaning this test can be used) for the duration of the COVID-19 de claration under Section 564(b)(1) of the Act, 21 U.S.C. section 360bbb-3(b)(1), unless the authorization is terminated or revoked sooner.   Performed at [facility], [address], [place], [place] [address]       Radiology Studies: No results found.  Pamella Pert, MD, PhD [facility]  Between 7 am - 7 pm I am available, please contact me via Amion or Securechat  Between 7 pm - 7 am I am  not available, please contact night coverage MD/APP via Amion

## 2020-08-20 NOTE — Plan of Care (Signed)
  Problem: Education: Goal: Knowledge of General Education information will improve Description: Including pain rating scale, medication(s)/side effects and non-pharmacologic comfort measures Outcome: Progressing   Problem: Clinical Measurements: Goal: Will remain free from infection Outcome: Progressing   

## 2020-08-21 DIAGNOSIS — J9601 Acute respiratory failure with hypoxia: Secondary | ICD-10-CM | POA: Diagnosis not present

## 2020-08-21 DIAGNOSIS — I10 Essential (primary) hypertension: Secondary | ICD-10-CM | POA: Diagnosis not present

## 2020-08-21 DIAGNOSIS — U071 COVID-19: Secondary | ICD-10-CM | POA: Diagnosis not present

## 2020-08-21 DIAGNOSIS — J1282 Pneumonia due to coronavirus disease 2019: Secondary | ICD-10-CM | POA: Diagnosis not present

## 2020-08-21 LAB — D-DIMER, QUANTITATIVE: D-Dimer, Quant: 1.05 ug/mL-FEU — ABNORMAL HIGH (ref 0.00–0.50)

## 2020-08-21 LAB — CBC
HCT: 39.7 % (ref 39.0–52.0)
Hemoglobin: 13.9 g/dL (ref 13.0–17.0)
MCH: 30.3 pg (ref 26.0–34.0)
MCHC: 35 g/dL (ref 30.0–36.0)
MCV: 86.7 fL (ref 80.0–100.0)
Platelets: 421 10*3/uL — ABNORMAL HIGH (ref 150–400)
RBC: 4.58 MIL/uL (ref 4.22–5.81)
RDW: 13 % (ref 11.5–15.5)
WBC: 6.9 10*3/uL (ref 4.0–10.5)
nRBC: 0 % (ref 0.0–0.2)

## 2020-08-21 LAB — COMPREHENSIVE METABOLIC PANEL
ALT: 213 U/L — ABNORMAL HIGH (ref 0–44)
AST: 134 U/L — ABNORMAL HIGH (ref 15–41)
Albumin: 2.8 g/dL — ABNORMAL LOW (ref 3.5–5.0)
Alkaline Phosphatase: 65 U/L (ref 38–126)
Anion gap: 12 (ref 5–15)
BUN: 22 mg/dL — ABNORMAL HIGH (ref 6–20)
CO2: 21 mmol/L — ABNORMAL LOW (ref 22–32)
Calcium: 8.3 mg/dL — ABNORMAL LOW (ref 8.9–10.3)
Chloride: 104 mmol/L (ref 98–111)
Creatinine, Ser: 0.81 mg/dL (ref 0.61–1.24)
GFR, Estimated: >60 mL/min (ref >60)
Glucose, Bld: 148 mg/dL — ABNORMAL HIGH (ref 70–99)
Potassium: 4.5 mmol/L (ref 3.5–5.1)
Sodium: 137 mmol/L (ref 135–145)
Total Bilirubin: 1 mg/dL (ref 0.3–1.2)
Total Protein: 6.2 g/dL — ABNORMAL LOW (ref 6.5–8.1)

## 2020-08-21 LAB — MAGNESIUM: Magnesium: 2.4 mg/dL (ref 1.7–2.4)

## 2020-08-21 LAB — C-REACTIVE PROTEIN: CRP: 1.1 mg/dL — ABNORMAL HIGH (ref <1.0)

## 2020-08-21 MED ORDER — FUROSEMIDE 10 MG/ML IJ SOLN
40.0000 mg | Freq: Once | INTRAMUSCULAR | Status: AC
  Administered 2020-08-21: 40 mg via INTRAVENOUS
  Filled 2020-08-21: qty 4

## 2020-08-21 NOTE — Progress Notes (Signed)
PROGRESS NOTE  Timothy Lang FYB:[id number] DOB: [birth year] DOA: 08/16/2020 PCP: Smitty Cords, DO   LOS: 5 days   Brief Narrative / Interim history: 55 year old male with HTN, HLD, who comes into the hospital with shortness of breath.  This has been going on for the past 5 to 6 days, also has been having nonproductive cough, intermittent nausea but no vomiting. He initially tested positive for Covid on 1/2.  Subjective / 24h Interval events: Appears a bit anxious this morning, tells me that he has not been able to sleep as he is afraid that his oxygen is going to drop.  Assessment & Plan:  Principal Problem Acute Hypoxic Respiratory Failure due to Covid-19 Viral Illness -Currently on steroids, baricitinib, continue.  Finished 5 days of Remdesivir -Continue supplemental oxygen to maintain sats above 88%, remains profoundly hypoxic on 15 L, wean off as tolerated -Incentive spirometry, flutter valve, prone as able. CRP improving, clinically improving -Lasix x1 today    COVID-19 Labs  Recent Labs    08/19/20 0526 08/20/20 0459  DDIMER 0.58* 0.98*  CRP 3.0* 1.4*    Lab Results  Component Value Date   SARSCOV2NAA POSITIVE (A) 08/13/2020    Active Problems Hypotonic hyponatremia -Likely in the setting of dehydration due to poor p.o. intake, sodium has now normalized, his p.o. intake is much better  Hypokalemia -Monitor and replete, potassium normalized  Hypertension -Hold home Norvasc, remains normotensive  Elevated LFTs -continue upward trend, likely due to Covid/Remdesivir.  Now off Remdesivir  Hyperlipidemia -Hold statin due to elevation in LFTs  Scheduled Meds: . baricitinib  4 mg Oral Daily  . enoxaparin (LOVENOX) injection  40 mg Subcutaneous Q24H  . Ipratropium-Albuterol  1 puff Inhalation Q6H WA  . methylPREDNISolone (SOLU-MEDROL) injection  60 mg Intravenous Q12H  . oxymetazoline  1 spray Each Nare BID  . pantoprazole  40 mg Oral Daily    Continuous Infusions:  PRN Meds:.acetaminophen **OR** acetaminophen, albuterol, benzonatate, ondansetron (ZOFRAN) IV, traZODone  DVT prophylaxis: Lovenox Code Status: Full code Family Communication: d/w patient, will update wife in the afternoon  Status is: Inpatient  Remains inpatient appropriate because:Inpatient level of care appropriate due to severity of illness  Dispo: The patient is from: Home              Anticipated d/c is to: Home              Anticipated d/c date is: 3 days              Patient currently is not medically stable to d/c.   Consultants:  None   Procedures:  None   Microbiology: None   Antibacterials: None    Objective: Vitals:   08/20/20 1238 08/20/20 1755 08/21/20 0630 08/21/20 0833  BP: 129/84 132/86 121/81 110/80  Pulse: 67 70 65 65  Resp: 19 17  20   Temp: 98.1 F (36.7 C) 98.3 F (36.8 C) 98.2 F (36.8 C) 98.9 F (37.2 C)  TempSrc: Oral  Oral Axillary  SpO2: 92% 94% 91% (!) 89%  Weight:      Height:        Intake/Output Summary (Last 24 hours) at 08/21/2020 1022 Last data filed at 08/20/2020 1300 Gross per 24 hour  Intake 100 ml  Output --  Net 100 ml   Filed Weights   08/18/20 1700 08/19/20 0511 08/20/20 0413  Weight: 96.3 kg 98.3 kg 95 kg    Examination:  Constitutional: NAD, in bed Eyes: no icterus  ENMT: mmm Neck: normal, supple Respiratory: bibasilar rhonchi, no wheezing Cardiovascular: rrr, no mrg, no edema  Abdomen: soft, nt, nd, bs+ Musculoskeletal: no clubbing / cyanosis.  Skin: no rashes Neurologic: non focal    Data Reviewed: I have independently reviewed following labs and imaging studies   CBC: Recent Labs  Lab 08/16/20 1300 08/17/20 0556 08/18/20 0548 08/19/20 0526 08/20/20 0459 08/21/20 0402  WBC 5.2 3.7* 7.4 6.2 6.8 6.9  NEUTROABS 4.1 2.8  --   --   --   --   HGB 14.1 13.7 13.6 13.9 13.9 13.9  HCT 40.3 38.9* 39.0 40.2 40.4 39.7  MCV 84.5 85.5 86.7 86.5 87.4 86.7  PLT 206 230 299 341  382 421*   Basic Metabolic Panel: Recent Labs  Lab 08/16/20 1800 08/17/20 0556 08/18/20 0548 08/19/20 0526 08/20/20 0459 08/21/20 0402  NA  --  135 138 139 138 137  K  --  3.9 4.0 4.2 4.2 4.5  CL  --  103 107 106 106 104  CO2  --  23 20* 21* 21* 21*  GLUCOSE  --  149* 150* 157* 165* 148*  BUN  --  20 24* 25* 24* 22*  CREATININE  --  0.88 0.71 0.79 0.76 0.81  CALCIUM  --  8.0* 8.3* 8.4* 8.3* 8.3*  MG 2.3 2.5*  --  2.6*  --  2.4  PHOS  --  3.7  --   --   --   --    GFR: Estimated Creatinine Clearance: 124.8 mL/min (by C-G formula based on SCr of 0.81 mg/dL). Liver Function Tests: Recent Labs  Lab 08/17/20 0556 08/18/20 0548 08/19/20 0526 08/20/20 0459 08/21/20 0402  AST 67* 69* 104* 117* 134*  ALT 41 56* 101* 160* 213*  ALKPHOS 62 60 62 63 65  BILITOT 0.7 0.7 0.7 0.9 1.0  PROT 6.8 6.6 6.4* 6.3* 6.2*  ALBUMIN 3.1* 2.9* 2.9* 2.8* 2.8*   No results for input(s): LIPASE, AMYLASE in the last 168 hours. No results for input(s): AMMONIA in the last 168 hours. Coagulation Profile: No results for input(s): INR, PROTIME in the last 168 hours. Cardiac Enzymes: No results for input(s): CKTOTAL, CKMB, CKMBINDEX, TROPONINI in the last 168 hours. BNP (last 3 results) No results for input(s): PROBNP in the last 8760 hours. HbA1C: No results for input(s): HGBA1C in the last 72 hours. CBG: No results for input(s): GLUCAP in the last 168 hours. Lipid Profile: No results for input(s): CHOL, HDL, LDLCALC, TRIG, CHOLHDL, LDLDIRECT in the last 72 hours. Thyroid Function Tests: No results for input(s): TSH, T4TOTAL, FREET4, T3FREE, THYROIDAB in the last 72 hours. Anemia Panel: No results for input(s): VITAMINB12, FOLATE, FERRITIN, TIBC, IRON, RETICCTPCT in the last 72 hours. Urine analysis:    Component Value Date/Time   COLORURINE YELLOW (A) 08/16/2020 1259   APPEARANCEUR CLEAR (A) 08/16/2020 1259   LABSPEC 1.009 08/16/2020 1259   PHURINE 6.0 08/16/2020 1259   GLUCOSEU NEGATIVE  08/16/2020 1259   HGBUR SMALL (A) 08/16/2020 1259   BILIRUBINUR NEGATIVE 08/16/2020 1259   KETONESUR 5 (A) 08/16/2020 1259   PROTEINUR 100 (A) 08/16/2020 1259   NITRITE NEGATIVE 08/16/2020 1259   LEUKOCYTESUR NEGATIVE 08/16/2020 1259   Sepsis Labs: Invalid input(s): PROCALCITONIN, LACTICIDVEN  Recent Results (from the past 240 hour(s))  SARS CORONAVIRUS 2 (TAT 6-24 HRS) Nasopharyngeal Nasopharyngeal Swab     Status: Abnormal   Collection Time: 08/13/20  5:14 PM   Specimen: Nasopharyngeal Swab  Result Value Ref Range Status  SARS Coronavirus 2 POSITIVE (A) NEGATIVE Final    Comment: EMAILED MELISSA BROWNING [phone] 08/14/2020 T. TYSOR (NOTE) SARS-CoV-2 target nucleic acids are DETECTED.  The SARS-CoV-2 RNA is generally detectable in upper and lower respiratory specimens during the acute phase of infection. Positive results are indicative of the presence of SARS-CoV-2 RNA. Clinical correlation with patient history and other diagnostic information is  necessary to determine patient infection status. Positive results do not rule out bacterial infection or co-infection with other viruses.  The expected result is Negative.  Fact Sheet for Patients: HairSlick.no  Fact Sheet for Healthcare Providers: quierodirigir.com  This test is not yet approved or cleared by the [place] FDA and  has been authorized for detection and/or diagnosis of SARS-CoV-2 by FDA under an Emergency Use Authorization (EUA). This EUA will remain  in effect (meaning this test can be used) for the duration of the COVID-19 de claration under Section 564(b)(1) of the Act, 21 U.S.C. section 360bbb-3(b)(1), unless the authorization is terminated or revoked sooner.   Performed at [facility], [address], [place], [place] [address]       Radiology Studies: No results found.  Pamella Pert, MD, PhD [facility]  Between 7 am - 7 pm  I am available, please contact me via Amion or Securechat  Between 7 pm - 7 am I am not available, please contact night coverage MD/APP via Amion

## 2020-08-21 NOTE — Progress Notes (Signed)
Patient educated on lying in prone position if possible and to continue using IS and flutter valve to help with respiratory status.  He is currently on 13L high flow and feels like his breathing has improved over the last day.

## 2020-08-21 NOTE — Plan of Care (Signed)
°  Problem: Coping: °Goal: Level of anxiety will decrease °Outcome: Progressing °  °

## 2020-08-22 DIAGNOSIS — I10 Essential (primary) hypertension: Secondary | ICD-10-CM | POA: Diagnosis not present

## 2020-08-22 DIAGNOSIS — U071 COVID-19: Secondary | ICD-10-CM | POA: Diagnosis not present

## 2020-08-22 DIAGNOSIS — J1282 Pneumonia due to coronavirus disease 2019: Secondary | ICD-10-CM | POA: Diagnosis not present

## 2020-08-22 DIAGNOSIS — J9601 Acute respiratory failure with hypoxia: Secondary | ICD-10-CM | POA: Diagnosis not present

## 2020-08-22 LAB — COMPREHENSIVE METABOLIC PANEL
ALT: 158 U/L — ABNORMAL HIGH (ref 0–44)
AST: 48 U/L — ABNORMAL HIGH (ref 15–41)
Albumin: 3 g/dL — ABNORMAL LOW (ref 3.5–5.0)
Alkaline Phosphatase: 70 U/L (ref 38–126)
Anion gap: 12 (ref 5–15)
BUN: 29 mg/dL — ABNORMAL HIGH (ref 6–20)
CO2: 22 mmol/L (ref 22–32)
Calcium: 8.7 mg/dL — ABNORMAL LOW (ref 8.9–10.3)
Chloride: 104 mmol/L (ref 98–111)
Creatinine, Ser: 0.85 mg/dL (ref 0.61–1.24)
GFR, Estimated: >60 mL/min (ref >60)
Glucose, Bld: 161 mg/dL — ABNORMAL HIGH (ref 70–99)
Potassium: 4.9 mmol/L (ref 3.5–5.1)
Sodium: 138 mmol/L (ref 135–145)
Total Bilirubin: 1 mg/dL (ref 0.3–1.2)
Total Protein: 6.9 g/dL (ref 6.5–8.1)

## 2020-08-22 LAB — CBC
HCT: 42.3 % (ref 39.0–52.0)
Hemoglobin: 14.3 g/dL (ref 13.0–17.0)
MCH: 29.6 pg (ref 26.0–34.0)
MCHC: 33.8 g/dL (ref 30.0–36.0)
MCV: 87.6 fL (ref 80.0–100.0)
Platelets: 519 10*3/uL — ABNORMAL HIGH (ref 150–400)
RBC: 4.83 MIL/uL (ref 4.22–5.81)
RDW: 12.9 % (ref 11.5–15.5)
WBC: 7 10*3/uL (ref 4.0–10.5)
nRBC: 0 % (ref 0.0–0.2)

## 2020-08-22 LAB — C-REACTIVE PROTEIN: CRP: 1.5 mg/dL — ABNORMAL HIGH (ref <1.0)

## 2020-08-22 LAB — D-DIMER, QUANTITATIVE: D-Dimer, Quant: 0.89 ug/mL-FEU — ABNORMAL HIGH (ref 0.00–0.50)

## 2020-08-22 MED ORDER — NALOXONE HCL 0.4 MG/ML IJ SOLN
INTRAMUSCULAR | Status: AC
  Filled 2020-08-22: qty 1

## 2020-08-22 MED ORDER — BLISTEX MEDICATED EX OINT
TOPICAL_OINTMENT | CUTANEOUS | Status: DC | PRN
  Filled 2020-08-22: qty 6.3

## 2020-08-22 MED ORDER — FUROSEMIDE 10 MG/ML IJ SOLN
40.0000 mg | Freq: Once | INTRAMUSCULAR | Status: AC
  Administered 2020-08-22: 40 mg via INTRAVENOUS
  Filled 2020-08-22: qty 4

## 2020-08-22 NOTE — Progress Notes (Signed)
PROGRESS NOTE  CORDAI RODRIGUE Lang:[id number] DOB: 04-13-1966 DOA: 08/16/2020 PCP: Smitty Cords, DO   LOS: 6 days   Brief Narrative / Interim history: 55 year old male with HTN, HLD, who comes into the hospital with shortness of breath.  This has been going on for the past 5 to 6 days, also has been having nonproductive cough, intermittent nausea but no vomiting. He initially tested positive for Covid on 1/2.  Slow to improve  Subjective / 24h Interval events: Feels a little bit better, appreciates that he has progressed in the last couple of days.  Slept better last night about 5 hours.  Continues to use his spirometer gallbladder valve.  Sitting at the edge of the bed most part of the day  Assessment & Plan:  Principal Problem Acute Hypoxic Respiratory Failure due to Covid-19 Viral Illness -Currently on steroids, baricitinib, continue.  Finished 5 days of Remdesivir -Continue supplemental oxygen to maintain sats above 88%, hypoxia improving currently on 13 L -Incentive spirometry, flutter valve, prone as able. CRP improving, clinically improving -Lasix 1/10, repeat 1/11   COVID-19 Labs  Recent Labs    08/20/20 0459 08/21/20 0402  DDIMER 0.98* 1.05*  CRP 1.4* 1.1*    Lab Results  Component Value Date   SARSCOV2NAA POSITIVE (A) 08/13/2020    Active Problems Hypotonic hyponatremia -Likely in the setting of dehydration due to poor p.o. intake, sodium has now normalized, his p.o. intake is much better briefly on fluids but now discontinued.  Continue Lasix as above, he has good p.o. intake  Hypokalemia -Monitor and replete, potassium now normalized, monitor while giving Lasix  Hypertension -Hold home Norvasc, remains normotensive today  Elevated LFTs -Due to Covid as well as Remdesivir, finished Remdesivir 1/10, now LFTs trending down  Hyperlipidemia -Hold statin due to elevation in LFTs  Scheduled Meds: . baricitinib  4 mg Oral Daily  . enoxaparin  (LOVENOX) injection  40 mg Subcutaneous Q24H  . Ipratropium-Albuterol  1 puff Inhalation Q6H WA  . methylPREDNISolone (SOLU-MEDROL) injection  60 mg Intravenous Q12H  . oxymetazoline  1 spray Each Nare BID  . pantoprazole  40 mg Oral Daily   Continuous Infusions:  PRN Meds:.acetaminophen **OR** acetaminophen, albuterol, benzonatate, ondansetron (ZOFRAN) IV, traZODone  DVT prophylaxis: Lovenox Code Status: Full code Family Communication: d/w patient, will update wife in the afternoon  Status is: Inpatient  Remains inpatient appropriate because:Inpatient level of care appropriate due to severity of illness  Dispo: The patient is from: Home              Anticipated d/c is to: Home              Anticipated d/c date is: 3 days              Patient currently is not medically stable to d/c.   Consultants:  None   Procedures:  None   Microbiology: None   Antibacterials: None    Objective: Vitals:   08/21/20 1600 08/21/20 2012 08/22/20 0323 08/22/20 0808  BP: 120/76 133/84 115/81 120/78  Pulse: 70 62 61 60  Resp: (!) 22 19 18 20   Temp: 98.7 F (37.1 C) 99 F (37.2 C) 98.8 F (37.1 C) 98.7 F (37.1 C)  TempSrc: Oral Oral  Oral  SpO2: 94% 92% 92% 96%  Weight:   94.8 kg   Height:        Intake/Output Summary (Last 24 hours) at 08/22/2020 0942 Last data filed at 08/22/2020 0809 Gross per 24  hour  Intake --  Output 1875 ml  Net -1875 ml   Filed Weights   08/19/20 0511 08/20/20 0413 08/22/20 0323  Weight: 98.3 kg 95 kg 94.8 kg    Examination:  Constitutional: No distress, in bed Eyes: No scleral icterus ENMT: mmm Neck: normal, supple Respiratory: Bibasilar rhonchi, no wheezing, no crackles, moves air well.  Tachypneic Cardiovascular: Heart is regular, no murmurs appreciated.  No peripheral edema Abdomen: Soft, nondistended, no tenderness to palpation, bowel sounds positive Musculoskeletal: no clubbing / cyanosis.  Skin: No rashes seen Neurologic: No focal  deficits, equal strength   Data Reviewed: I have independently reviewed following labs and imaging studies   CBC: Recent Labs  Lab 08/16/20 1300 08/17/20 0556 08/18/20 0548 08/19/20 0526 08/20/20 0459 08/21/20 0402 08/22/20 0704  WBC 5.2 3.7* 7.4 6.2 6.8 6.9 7.0  NEUTROABS 4.1 2.8  --   --   --   --   --   HGB 14.1 13.7 13.6 13.9 13.9 13.9 14.3  HCT 40.3 38.9* 39.0 40.2 40.4 39.7 42.3  MCV 84.5 85.5 86.7 86.5 87.4 86.7 87.6  PLT 206 230 299 341 382 421* 519*   Basic Metabolic Panel: Recent Labs  Lab 08/16/20 1800 08/17/20 0556 08/18/20 0548 08/19/20 0526 08/20/20 0459 08/21/20 0402 08/22/20 0704  NA  --  135 138 139 138 137 138  K  --  3.9 4.0 4.2 4.2 4.5 4.9  CL  --  103 107 106 106 104 104  CO2  --  23 20* 21* 21* 21* 22  GLUCOSE  --  149* 150* 157* 165* 148* 161*  BUN  --  20 24* 25* 24* 22* 29*  CREATININE  --  0.88 0.71 0.79 0.76 0.81 0.85  CALCIUM  --  8.0* 8.3* 8.4* 8.3* 8.3* 8.7*  MG 2.3 2.5*  --  2.6*  --  2.4  --   PHOS  --  3.7  --   --   --   --   --    GFR: Estimated Creatinine Clearance: 118.7 mL/min (by C-G formula based on SCr of 0.85 mg/dL). Liver Function Tests: Recent Labs  Lab 08/18/20 0548 08/19/20 0526 08/20/20 0459 08/21/20 0402 08/22/20 0704  AST 69* 104* 117* 134* 48*  ALT 56* 101* 160* 213* 158*  ALKPHOS 60 62 63 65 70  BILITOT 0.7 0.7 0.9 1.0 1.0  PROT 6.6 6.4* 6.3* 6.2* 6.9  ALBUMIN 2.9* 2.9* 2.8* 2.8* 3.0*   No results for input(s): LIPASE, AMYLASE in the last 168 hours. No results for input(s): AMMONIA in the last 168 hours. Coagulation Profile: No results for input(s): INR, PROTIME in the last 168 hours. Cardiac Enzymes: No results for input(s): CKTOTAL, CKMB, CKMBINDEX, TROPONINI in the last 168 hours. BNP (last 3 results) No results for input(s): PROBNP in the last 8760 hours. HbA1C: No results for input(s): HGBA1C in the last 72 hours. CBG: No results for input(s): GLUCAP in the last 168 hours. Lipid  Profile: No results for input(s): CHOL, HDL, LDLCALC, TRIG, CHOLHDL, LDLDIRECT in the last 72 hours. Thyroid Function Tests: No results for input(s): TSH, T4TOTAL, FREET4, T3FREE, THYROIDAB in the last 72 hours. Anemia Panel: No results for input(s): VITAMINB12, FOLATE, FERRITIN, TIBC, IRON, RETICCTPCT in the last 72 hours. Urine analysis:    Component Value Date/Time   COLORURINE YELLOW (A) 08/16/2020 1259   APPEARANCEUR CLEAR (A) 08/16/2020 1259   LABSPEC 1.009 08/16/2020 1259   PHURINE 6.0 08/16/2020 1259   GLUCOSEU NEGATIVE 08/16/2020  1259   HGBUR SMALL (A) 08/16/2020 1259   BILIRUBINUR NEGATIVE 08/16/2020 1259   KETONESUR 5 (A) 08/16/2020 1259   PROTEINUR 100 (A) 08/16/2020 1259   NITRITE NEGATIVE 08/16/2020 1259   LEUKOCYTESUR NEGATIVE 08/16/2020 1259   Sepsis Labs: Invalid input(s): PROCALCITONIN, LACTICIDVEN  Recent Results (from the past 240 hour(s))  SARS CORONAVIRUS 2 (TAT 6-24 HRS) Nasopharyngeal Nasopharyngeal Swab     Status: Abnormal   Collection Time: 08/13/20  5:14 PM   Specimen: Nasopharyngeal Swab  Result Value Ref Range Status   SARS Coronavirus 2 POSITIVE (A) NEGATIVE Final    Comment: EMAILED MELISSA BROWNING 0541 08/14/2020 T. TYSOR (NOTE) SARS-CoV-2 target nucleic acids are DETECTED.  The SARS-CoV-2 RNA is generally detectable in upper and lower respiratory specimens during the acute phase of infection. Positive results are indicative of the presence of SARS-CoV-2 RNA. Clinical correlation with patient history and other diagnostic information is  necessary to determine patient infection status. Positive results do not rule out bacterial infection or co-infection with other viruses.  The expected result is Negative.  Fact Sheet for Patients: HairSlick.no  Fact Sheet for Healthcare Providers: quierodirigir.com  This test is not yet approved or cleared by the [place] FDA and  has been  authorized for detection and/or diagnosis of SARS-CoV-2 by FDA under an Emergency Use Authorization (EUA). This EUA will remain  in effect (meaning this test can be used) for the duration of the COVID-19 de claration under Section 564(b)(1) of the Act, 21 U.S.C. section 360bbb-3(b)(1), unless the authorization is terminated or revoked sooner.   Performed at [facility], [address], [place], [place] [address]       Radiology Studies: No results found.  Pamella Pert, MD, PhD [facility]  Between 7 am - 7 pm I am available, please contact me via Amion or Securechat  Between 7 pm - 7 am I am not available, please contact night coverage MD/APP via Amion

## 2020-08-23 DIAGNOSIS — U071 COVID-19: Secondary | ICD-10-CM | POA: Diagnosis not present

## 2020-08-23 DIAGNOSIS — J1282 Pneumonia due to coronavirus disease 2019: Secondary | ICD-10-CM | POA: Diagnosis not present

## 2020-08-23 LAB — CBC
HCT: 42 % (ref 39.0–52.0)
Hemoglobin: 14.7 g/dL (ref 13.0–17.0)
MCH: 30.2 pg (ref 26.0–34.0)
MCHC: 35 g/dL (ref 30.0–36.0)
MCV: 86.2 fL (ref 80.0–100.0)
Platelets: 534 10*3/uL — ABNORMAL HIGH (ref 150–400)
RBC: 4.87 MIL/uL (ref 4.22–5.81)
RDW: 12.8 % (ref 11.5–15.5)
WBC: 8.5 10*3/uL (ref 4.0–10.5)
nRBC: 0 % (ref 0.0–0.2)

## 2020-08-23 LAB — COMPREHENSIVE METABOLIC PANEL
ALT: 146 U/L — ABNORMAL HIGH (ref 0–44)
AST: 47 U/L — ABNORMAL HIGH (ref 15–41)
Albumin: 3 g/dL — ABNORMAL LOW (ref 3.5–5.0)
Alkaline Phosphatase: 76 U/L (ref 38–126)
Anion gap: 11 (ref 5–15)
BUN: 31 mg/dL — ABNORMAL HIGH (ref 6–20)
CO2: 23 mmol/L (ref 22–32)
Calcium: 8.5 mg/dL — ABNORMAL LOW (ref 8.9–10.3)
Chloride: 105 mmol/L (ref 98–111)
Creatinine, Ser: 0.8 mg/dL (ref 0.61–1.24)
GFR, Estimated: >60 mL/min (ref >60)
Glucose, Bld: 157 mg/dL — ABNORMAL HIGH (ref 70–99)
Potassium: 4.9 mmol/L (ref 3.5–5.1)
Sodium: 139 mmol/L (ref 135–145)
Total Bilirubin: 1 mg/dL (ref 0.3–1.2)
Total Protein: 6.6 g/dL (ref 6.5–8.1)

## 2020-08-23 LAB — FIBRIN DERIVATIVES D-DIMER (ARMC ONLY): Fibrin derivatives D-dimer (ARMC): 740.34 ng/mL (FEU) — ABNORMAL HIGH (ref 0.00–499.00)

## 2020-08-23 LAB — C-REACTIVE PROTEIN: CRP: 0.7 mg/dL (ref <1.0)

## 2020-08-23 NOTE — Hospital Course (Signed)
55 year old male with HTN, HLD, who comes into the hospital with shortness of breath.  This has been going on for the past 5 to 6 days, also has been having nonproductive cough, intermittent nausea but no vomiting. He initially tested positive for Covid on 1/2.  Has been slow to improve.
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
## 2020-08-24 DIAGNOSIS — U071 COVID-19: Secondary | ICD-10-CM | POA: Diagnosis not present

## 2020-08-24 DIAGNOSIS — J1282 Pneumonia due to coronavirus disease 2019: Secondary | ICD-10-CM | POA: Diagnosis not present

## 2020-08-24 LAB — COMPREHENSIVE METABOLIC PANEL
ALT: 135 U/L — ABNORMAL HIGH (ref 0–44)
AST: 41 U/L (ref 15–41)
Albumin: 2.9 g/dL — ABNORMAL LOW (ref 3.5–5.0)
Alkaline Phosphatase: 74 U/L (ref 38–126)
Anion gap: 8 (ref 5–15)
BUN: 24 mg/dL — ABNORMAL HIGH (ref 6–20)
CO2: 23 mmol/L (ref 22–32)
Calcium: 8.4 mg/dL — ABNORMAL LOW (ref 8.9–10.3)
Chloride: 103 mmol/L (ref 98–111)
Creatinine, Ser: 0.77 mg/dL (ref 0.61–1.24)
GFR, Estimated: >60 mL/min (ref >60)
Glucose, Bld: 153 mg/dL — ABNORMAL HIGH (ref 70–99)
Potassium: 4.8 mmol/L (ref 3.5–5.1)
Sodium: 134 mmol/L — ABNORMAL LOW (ref 135–145)
Total Bilirubin: 0.9 mg/dL (ref 0.3–1.2)
Total Protein: 6.5 g/dL (ref 6.5–8.1)

## 2020-08-24 LAB — CBC
HCT: 42.7 % (ref 39.0–52.0)
Hemoglobin: 14.2 g/dL (ref 13.0–17.0)
MCH: 29.2 pg (ref 26.0–34.0)
MCHC: 33.3 g/dL (ref 30.0–36.0)
MCV: 87.7 fL (ref 80.0–100.0)
Platelets: 524 10*3/uL — ABNORMAL HIGH (ref 150–400)
RBC: 4.87 MIL/uL (ref 4.22–5.81)
RDW: 12.8 % (ref 11.5–15.5)
WBC: 9.6 10*3/uL (ref 4.0–10.5)
nRBC: 0 % (ref 0.0–0.2)

## 2020-08-24 LAB — C-REACTIVE PROTEIN: CRP: 0.6 mg/dL (ref <1.0)

## 2020-08-24 LAB — FIBRIN DERIVATIVES D-DIMER (ARMC ONLY): Fibrin derivatives D-dimer (ARMC): 595.68 ng/mL (FEU) — ABNORMAL HIGH (ref 0.00–499.00)

## 2020-08-24 MED ORDER — SALINE SPRAY 0.65 % NA SOLN
1.0000 | NASAL | Status: DC | PRN
  Filled 2020-08-24: qty 44

## 2020-08-24 MED ORDER — FLUTICASONE PROPIONATE 50 MCG/ACT NA SUSP
2.0000 | Freq: Every day | NASAL | Status: DC
  Administered 2020-08-24 – 2020-08-28 (×5): 2 via NASAL
  Filled 2020-08-24: qty 16

## 2020-08-24 NOTE — Progress Notes (Signed)
PROGRESS NOTE    Timothy Lang   VVO:[id number]  DOB: [birth year]  PCP: Smitty Cords, DO    DOA: 08/16/2020 LOS: 8   Brief Narrative   55 year old male with HTN, HLD, who comes into the hospital with shortness of breath.  This has been going on for the past 5 to 6 days, also has been having nonproductive cough, intermittent nausea but no vomiting. He initially tested positive for Covid on 1/2.  Has been slow to improve.     Assessment & Plan   Principal Problem:   Pneumonia due to COVID-19 virus Active Problems:   Essential hypertension   Mixed hyperlipidemia   Shortness of breath   Acute hyponatremia   Hypokalemia   Acute respiratory failure with hypoxia secondary to COVID-19 infection -present on admission. Slowly improving but continues to require 13 L/min high flow nasal cannula oxygen.  Also continues to have significant dyspnea on any exertion. Completed 5 days remdesivir. Treated with Lasix on 1/10 and 1/11, currently euvolemic.  Pt request hold off on further Lasix due to shortness of breath when up to urinate frequently) -- Continue steroids, baricitinib -- Continue supplemental oxygen as needed to maintain sat above 88%, wean as tolerated -- Supportive care: Incentive spirometry, flutter valve, prone as tolerated -- Monitor inflammatory markers, CMP, CBC -- Reassess volume status daily, consider further diuresis if needed -- BNP and chest x-ray in the morning  Hypotonic hyponatremia -resolved with IV fluids.  POA, likely due to dehydration and poor p.o. intake. -- Monitor BMP  Hypokalemia -replaced.  Monitor and replace further as needed.  Thrombocytosis -most likely reactive in setting of infection.  Monitor CBC.  Hypertension -continue amlodipine.  BP stable.  Elevated LFTs -likely due to COVID infection as well as remdesivir which was finished on 1/10.  LFTs are improving.  Monitor CMP  Hyperlipidemia -statin is currently held due to elevated  LFTs  Patient BMI: Body mass index is 27.86 kg/m.   DVT prophylaxis: enoxaparin (LOVENOX) injection 40 mg Start: 08/20/20 1000   Diet:  Diet Orders (From admission, onward)    Start     Ordered   08/16/20 2302  Diet regular Room service appropriate? Yes; Fluid consistency: Thin  Diet effective now       Question Answer Comment  Room service appropriate? Yes   Fluid consistency: Thin      08/16/20 2301            Code Status: Full Code    Subjective 08/24/20    Patient seen today at bedside.  Reports nasal/sinus congestion which he says is a chronic problem since childhood.  Not much relief with Afrin spray.  Says he uses Flonase at home, saline spray also helps.  No fevers or chills, still is somewhat short of breath on exertion.  No other acute complaints.   Disposition Plan & Communication   Status is: Inpatient  Remains inpatient appropriate because:Inpatient level of care appropriate due to severity of illness with significant supplemental oxygen requirement as above.   Dispo: The patient is from: Home              Anticipated d/c is to: Home              Anticipated d/c date is: 2 to 3 days              Patient currently is not medically stable to d/c.   Family Communication: None at bedside, will attempt to call  Consults, Procedures, Significant Events   Consultants:   None  Procedures:   None  Antimicrobials:  Anti-infectives (From admission, onward)   Start     Dose/Rate Route Frequency Ordered Stop   08/17/20 1000  remdesivir 100 mg in sodium chloride 0.9 % 100 mL IVPB       "Followed by" Linked Group Details   100 mg 200 mL/hr over 30 Minutes Intravenous Daily 08/16/20 1739 08/20/20 1113   08/16/20 1830  remdesivir 200 mg in sodium chloride 0.9% 250 mL IVPB       "Followed by" Linked Group Details   200 mg 580 mL/hr over 30 Minutes Intravenous Once 08/16/20 1739 08/17/20 0207        Objective   Vitals:   08/24/20 0853 08/24/20 1228  08/24/20 1503 08/24/20 1633  BP: (!) 121/97 (!) 120/97    Pulse: 60 65    Resp:  18    Temp:  97.9 F (36.6 C)  (!) 97.5 F (36.4 C)  TempSrc:  Oral  Oral  SpO2: 92% 96% 91%   Weight:      Height:        Intake/Output Summary (Last 24 hours) at 08/24/2020 1723 Last data filed at 08/24/2020 1400 Gross per 24 hour  Intake 1080 ml  Output 850 ml  Net 230 ml   Filed Weights   08/22/20 0323 08/23/20 0353 08/24/20 0308  Weight: 94.8 kg 93.5 kg 93.2 kg    Physical Exam:  General exam: awake, alert, no acute distress HEENT: moist mucus membranes, hearing grossly normal, sounds congested Respiratory system: Symmetric chest rise, normal respiratory effort at rest, on 13 L/min HFNC oxygen. Cardiovascular system: RRR, no pedal edema.   Central nervous system: A&O x3. no gross focal neurologic deficits, normal speech Extremities: moves all, no edema, normal tone Psychiatry: normal mood, congruent affect, judgement and insight appear normal   Labs   Data Reviewed: I have personally reviewed following labs and imaging studies  CBC: Recent Labs  Lab 08/20/20 0459 08/21/20 0402 08/22/20 0704 08/23/20 0532 08/24/20 0430  WBC 6.8 6.9 7.0 8.5 9.6  HGB 13.9 13.9 14.3 14.7 14.2  HCT 40.4 39.7 42.3 42.0 42.7  MCV 87.4 86.7 87.6 86.2 87.7  PLT 382 421* 519* 534* 524*   Basic Metabolic Panel: Recent Labs  Lab 08/19/20 0526 08/20/20 0459 08/21/20 0402 08/22/20 0704 08/23/20 0532 08/24/20 0430  NA 139 138 137 138 139 134*  K 4.2 4.2 4.5 4.9 4.9 4.8  CL 106 106 104 104 105 103  CO2 21* 21* 21* 22 23 23   GLUCOSE 157* 165* 148* 161* 157* 153*  BUN 25* 24* 22* 29* 31* 24*  CREATININE 0.79 0.76 0.81 0.85 0.80 0.77  CALCIUM 8.4* 8.3* 8.3* 8.7* 8.5* 8.4*  MG 2.6*  --  2.4  --   --   --    GFR: Estimated Creatinine Clearance: 125.1 mL/min (by C-G formula based on SCr of 0.77 mg/dL). Liver Function Tests: Recent Labs  Lab 08/20/20 0459 08/21/20 0402 08/22/20 0704  08/23/20 0532 08/24/20 0430  AST 117* 134* 48* 47* 41  ALT 160* 213* 158* 146* 135*  ALKPHOS 63 65 70 76 74  BILITOT 0.9 1.0 1.0 1.0 0.9  PROT 6.3* 6.2* 6.9 6.6 6.5  ALBUMIN 2.8* 2.8* 3.0* 3.0* 2.9*   No results for input(s): LIPASE, AMYLASE in the last 168 hours. No results for input(s): AMMONIA in the last 168 hours. Coagulation Profile: No results for input(s): INR, PROTIME in  the last 168 hours. Cardiac Enzymes: No results for input(s): CKTOTAL, CKMB, CKMBINDEX, TROPONINI in the last 168 hours. BNP (last 3 results) No results for input(s): PROBNP in the last 8760 hours. HbA1C: No results for input(s): HGBA1C in the last 72 hours. CBG: No results for input(s): GLUCAP in the last 168 hours. Lipid Profile: No results for input(s): CHOL, HDL, LDLCALC, TRIG, CHOLHDL, LDLDIRECT in the last 72 hours. Thyroid Function Tests: No results for input(s): TSH, T4TOTAL, FREET4, T3FREE, THYROIDAB in the last 72 hours. Anemia Panel: No results for input(s): VITAMINB12, FOLATE, FERRITIN, TIBC, IRON, RETICCTPCT in the last 72 hours. Sepsis Labs: No results for input(s): PROCALCITON, LATICACIDVEN in the last 168 hours.  No results found for this or any previous visit (from the past 240 hour(s)).    Imaging Studies   No results found.   Medications   Scheduled Meds: . baricitinib  4 mg Oral Daily  . enoxaparin (LOVENOX) injection  40 mg Subcutaneous Q24H  . fluticasone  2 spray Each Nare Daily  . Ipratropium-Albuterol  1 puff Inhalation Q6H WA  . methylPREDNISolone (SOLU-MEDROL) injection  60 mg Intravenous Q12H  . pantoprazole  40 mg Oral Daily   Continuous Infusions:     LOS: 8 days    Time spent: 25 minutes with greater than 50% spent at bedside and in coordination of care    Pennie Banter, DO [facility]  08/24/2020, 5:23 PM    If 7PM-7AM, please contact night-coverage. How to contact the [facility] Attending or Consulting provider 7A - 7P or covering provider  during after hours 7P -7A, for this patient?    1. Check the care team in [facility] and look for a) attending/consulting TR[facility] provider listed and b) the [facility] team listed 2. Log into [email or web address] and use [place]'s universal password to access. If you do not have the password, please contact the hospital operator. 3. Locate the [facility] provider you are looking for under [facility] and page to a number that you can be directly reached. 4. If you still have difficulty reaching the provider, please page the [facility] (Director on Call) for the Hospitalists listed on amion for assistance.

## 2020-08-25 ENCOUNTER — Inpatient Hospital Stay: Payer: BC Managed Care – PPO

## 2020-08-25 DIAGNOSIS — J1282 Pneumonia due to coronavirus disease 2019: Secondary | ICD-10-CM | POA: Diagnosis not present

## 2020-08-25 DIAGNOSIS — U071 COVID-19: Secondary | ICD-10-CM | POA: Diagnosis not present

## 2020-08-25 LAB — COMPREHENSIVE METABOLIC PANEL
ALT: 118 U/L — ABNORMAL HIGH (ref 0–44)
AST: 34 U/L (ref 15–41)
Albumin: 2.8 g/dL — ABNORMAL LOW (ref 3.5–5.0)
Alkaline Phosphatase: 70 U/L (ref 38–126)
Anion gap: 9 (ref 5–15)
BUN: 22 mg/dL — ABNORMAL HIGH (ref 6–20)
CO2: 24 mmol/L (ref 22–32)
Calcium: 8.4 mg/dL — ABNORMAL LOW (ref 8.9–10.3)
Chloride: 102 mmol/L (ref 98–111)
Creatinine, Ser: 0.69 mg/dL (ref 0.61–1.24)
GFR, Estimated: >60 mL/min (ref >60)
Glucose, Bld: 142 mg/dL — ABNORMAL HIGH (ref 70–99)
Potassium: 4.8 mmol/L (ref 3.5–5.1)
Sodium: 135 mmol/L (ref 135–145)
Total Bilirubin: 0.7 mg/dL (ref 0.3–1.2)
Total Protein: 6.1 g/dL — ABNORMAL LOW (ref 6.5–8.1)

## 2020-08-25 LAB — BRAIN NATRIURETIC PEPTIDE: B Natriuretic Peptide: 43.2 pg/mL (ref 0.0–100.0)

## 2020-08-25 NOTE — Progress Notes (Signed)
PROGRESS NOTE    Timothy Lang   GYF:[id number]  DOB: April 13, 1966  PCP: Smitty Cords, DO    DOA: 08/16/2020 LOS: 17   Brief Narrative   55 year old male with HTN, HLD, who comes into the hospital with shortness of breath.  This has been going on for the past 5 to 6 days, also has been having nonproductive cough, intermittent nausea but no vomiting. He initially tested positive for Covid on 1/2.  Has been slow to improve.     Assessment & Plan   Principal Problem:   Pneumonia due to COVID-19 virus Active Problems:   Essential hypertension   Mixed hyperlipidemia   Shortness of breath   Acute hyponatremia   Hypokalemia   Acute respiratory failure with hypoxia secondary to COVID-19 infection -present on admission. Slowly improving but continues to require 13 L/min high flow nasal cannula oxygen.  Also continues to have significant dyspnea on any exertion. Completed 5 days remdesivir. Treated with Lasix on 1/10 and 1/11, currently euvolemic.  Pt request hold off on further Lasix due to shortness of breath when up to urinate frequently) 1/14: Patient remains on 10 to 12 L/min HFNC, BNP normal this morning and chest x-ray stable -- Continue steroids, baricitinib -- Continue supplemental oxygen as needed to maintain sat above 88%, wean as tolerated -- Supportive care: Incentive spirometry, flutter valve, prone as tolerated -- Monitor inflammatory markers, CMP, CBC -- Reassess volume status daily, consider further diuresis if needed -- BNP and chest x-ray in the morning  Hypotonic hyponatremia -resolved with IV fluids.  POA, likely due to dehydration and poor p.o. intake. -- Monitor BMP  Hypokalemia -replaced.  Monitor and replace further as needed.  Thrombocytosis -most likely reactive in setting of infection.  Monitor CBC.  Hypertension -continue amlodipine.  BP stable.  Elevated LFTs -likely due to COVID infection as well as remdesivir which was finished on 1/10.   LFTs are improving.  Monitor CMP  Hyperlipidemia -statin is currently held due to elevated LFTs  Patient BMI: Body mass index is 27.8 kg/m.   DVT prophylaxis: enoxaparin (LOVENOX) injection 40 mg Start: 08/20/20 1000   Diet:  Diet Orders (From admission, onward)    Start     Ordered   08/16/20 2302  Diet regular Room service appropriate? Yes; Fluid consistency: Thin  Diet effective now       Question Answer Comment  Room service appropriate? Yes   Fluid consistency: Thin      08/16/20 2301            Code Status: Full Code    Subjective 08/25/20    Patient seen today at bedside.  Reports overall feeling well.  He is having interruptions in sleep at night due to waking up sweaty.  No fevers or chills though.  States cough is improving, getting up less phlegm.  No other acute complaints.     Disposition Plan & Communication   Status is: Inpatient  Remains inpatient appropriate because:Inpatient level of care appropriate due to severity of illness with significant supplemental oxygen requirement as above.   Dispo: The patient is from: Home              Anticipated d/c is to: Home              Anticipated d/c date is: 2 to 3 days if able to wean down oxygen              Patient currently is not  medically stable to d/c.   Family Communication: None at bedside, will attempt to call   Consults, Procedures, Significant Events   Consultants:   None  Procedures:   None  Antimicrobials:  Anti-infectives (From admission, onward)   Start     Dose/Rate Route Frequency Ordered Stop   08/17/20 1000  remdesivir 100 mg in sodium chloride 0.9 % 100 mL IVPB       "Followed by" Linked Group Details   100 mg 200 mL/hr over 30 Minutes Intravenous Daily 08/16/20 1739 08/20/20 1113   08/16/20 1830  remdesivir 200 mg in sodium chloride 0.9% 250 mL IVPB       "Followed by" Linked Group Details   200 mg 580 mL/hr over 30 Minutes Intravenous Once 08/16/20 1739 08/17/20 0207         Objective   Vitals:   08/25/20 0727 08/25/20 1040 08/25/20 1124 08/25/20 1538  BP: 135/83  124/79 118/74  Pulse: 62  62 69  Resp: 20  18 20   Temp: 98.5 F (36.9 C)  98.2 F (36.8 C) 98 F (36.7 C)  TempSrc: Oral  Oral Oral  SpO2: 97% 97% 96% 97%  Weight:      Height:        Intake/Output Summary (Last 24 hours) at 08/25/2020 1711 Last data filed at 08/25/2020 1007 Gross per 24 hour  Intake 360 ml  Output 1150 ml  Net -790 ml   Filed Weights   08/24/20 0308 08/25/20 0343 08/25/20 0600  Weight: 93.2 kg 93.3 kg 93 kg    Physical Exam:  General exam: awake, alert, no acute distress Respiratory system: Symmetric chest rise, normal respiratory effort at rest, on 11 L/min HFNC oxygen. Cardiovascular system: RRR, no pedal edema.   Central nervous system: A&O x3. no gross focal neurologic deficits, normal speech Extremities: moves all, no edema, no cyanosis    Labs   Data Reviewed: I have personally reviewed following labs and imaging studies  CBC: Recent Labs  Lab 08/20/20 0459 08/21/20 0402 08/22/20 0704 08/23/20 0532 08/24/20 0430  WBC 6.8 6.9 7.0 8.5 9.6  HGB 13.9 13.9 14.3 14.7 14.2  HCT 40.4 39.7 42.3 42.0 42.7  MCV 87.4 86.7 87.6 86.2 87.7  PLT 382 421* 519* 534* 524*   Basic Metabolic Panel: Recent Labs  Lab 08/19/20 0526 08/20/20 0459 08/21/20 0402 08/22/20 0704 08/23/20 0532 08/24/20 0430 08/25/20 0649  NA 139   < > 137 138 139 134* 135  K 4.2   < > 4.5 4.9 4.9 4.8 4.8  CL 106   < > 104 104 105 103 102  CO2 21*   < > 21* 22 23 23 24   GLUCOSE 157*   < > 148* 161* 157* 153* 142*  BUN 25*   < > 22* 29* 31* 24* 22*  CREATININE 0.79   < > 0.81 0.85 0.80 0.77 0.69  CALCIUM 8.4*   < > 8.3* 8.7* 8.5* 8.4* 8.4*  MG 2.6*  --  2.4  --   --   --   --    < > = values in this interval not displayed.   GFR: Estimated Creatinine Clearance: 115.9 mL/min (by C-G formula based on SCr of 0.69 mg/dL). Liver Function Tests: Recent Labs  Lab  08/21/20 0402 08/22/20 0704 08/23/20 0532 08/24/20 0430 08/25/20 0649  AST 134* 48* 47* 41 34  ALT 213* 158* 146* 135* 118*  ALKPHOS 65 70 76 74 70  BILITOT 1.0 1.0 1.0 0.9  0.7  PROT 6.2* 6.9 6.6 6.5 6.1*  ALBUMIN 2.8* 3.0* 3.0* 2.9* 2.8*   No results for input(s): LIPASE, AMYLASE in the last 168 hours. No results for input(s): AMMONIA in the last 168 hours. Coagulation Profile: No results for input(s): INR, PROTIME in the last 168 hours. Cardiac Enzymes: No results for input(s): CKTOTAL, CKMB, CKMBINDEX, TROPONINI in the last 168 hours. BNP (last 3 results) No results for input(s): PROBNP in the last 8760 hours. HbA1C: No results for input(s): HGBA1C in the last 72 hours. CBG: No results for input(s): GLUCAP in the last 168 hours. Lipid Profile: No results for input(s): CHOL, HDL, LDLCALC, TRIG, CHOLHDL, LDLDIRECT in the last 72 hours. Thyroid Function Tests: No results for input(s): TSH, T4TOTAL, FREET4, T3FREE, THYROIDAB in the last 72 hours. Anemia Panel: No results for input(s): VITAMINB12, FOLATE, FERRITIN, TIBC, IRON, RETICCTPCT in the last 72 hours. Sepsis Labs: No results for input(s): PROCALCITON, LATICACIDVEN in the last 168 hours.  No results found for this or any previous visit (from the past 240 hour(s)).    Imaging Studies   DG Chest Port 1 View  Result Date: 08/25/2020 CLINICAL DATA:  COVID 19 positive. Respiratory failure. Ex-smoker. Hypertension. EXAM: PORTABLE CHEST 1 VIEW COMPARISON:  08/16/2020 FINDINGS: Midline trachea. Mild cardiomegaly. No pleural effusion or pneumothorax. Low lung volumes. Peripheral predominant, left greater than right interstitial and airspace opacities. Slightly increased on the left and decreased on the right. IMPRESSION: Shifting peripheral interstitial opacities, consistent with COVID-19 pneumonia. Overall similar in severity to 08/16/2020. Electronically Signed   By: Jeronimo Greaves M.D.   On: 08/25/2020 08:29     Medications    Scheduled Meds: . baricitinib  4 mg Oral Daily  . enoxaparin (LOVENOX) injection  40 mg Subcutaneous Q24H  . fluticasone  2 spray Each Nare Daily  . Ipratropium-Albuterol  1 puff Inhalation Q6H WA  . methylPREDNISolone (SOLU-MEDROL) injection  60 mg Intravenous Q12H  . pantoprazole  40 mg Oral Daily   Continuous Infusions:     LOS: 9 days    Time spent: 25 minutes with greater than 50% spent at bedside and in coordination of care    Pennie Banter, DO [facility]  08/25/2020, 5:11 PM    If 7PM-7AM, please contact night-coverage. How to contact the [facility] Attending or Consulting provider 7A - 7P or covering provider during after hours 7P -7A, for this patient?    1. Check the care team in [facility] and look for a) attending/consulting TR[facility] provider listed and b) the [facility] team listed 2. Log into [email or web address] and use [place]'s universal password to access. If you do not have the password, please contact the hospital operator. 3. Locate the [facility] provider you are looking for under [facility] and page to a number that you can be directly reached. 4. If you still have difficulty reaching the provider, please page the [facility] (Director on Call) for the Hospitalists listed on amion for assistance.

## 2020-08-25 NOTE — Progress Notes (Signed)
Patient transfer to 1C Room 114. Report called to [employer].

## 2020-08-26 DIAGNOSIS — J1282 Pneumonia due to coronavirus disease 2019: Secondary | ICD-10-CM | POA: Diagnosis not present

## 2020-08-26 DIAGNOSIS — U071 COVID-19: Secondary | ICD-10-CM | POA: Diagnosis not present

## 2020-08-26 LAB — COMPREHENSIVE METABOLIC PANEL
ALT: 136 U/L — ABNORMAL HIGH (ref 0–44)
AST: 34 U/L (ref 15–41)
Albumin: 2.8 g/dL — ABNORMAL LOW (ref 3.5–5.0)
Alkaline Phosphatase: 68 U/L (ref 38–126)
Anion gap: 7 (ref 5–15)
BUN: 22 mg/dL — ABNORMAL HIGH (ref 6–20)
CO2: 27 mmol/L (ref 22–32)
Calcium: 8.4 mg/dL — ABNORMAL LOW (ref 8.9–10.3)
Chloride: 95 mmol/L — ABNORMAL LOW (ref 98–111)
Creatinine, Ser: 0.72 mg/dL (ref 0.61–1.24)
GFR, Estimated: >60 mL/min (ref >60)
Glucose, Bld: 126 mg/dL — ABNORMAL HIGH (ref 70–99)
Potassium: 5.1 mmol/L (ref 3.5–5.1)
Sodium: 129 mmol/L — ABNORMAL LOW (ref 135–145)
Total Bilirubin: 0.7 mg/dL (ref 0.3–1.2)
Total Protein: 6.1 g/dL — ABNORMAL LOW (ref 6.5–8.1)

## 2020-08-26 LAB — OSMOLALITY: Osmolality: 291 mOsm/kg (ref 275–295)

## 2020-08-26 MED ORDER — PREDNISONE 50 MG PO TABS
50.0000 mg | ORAL_TABLET | Freq: Every day | ORAL | Status: DC
  Administered 2020-08-27 – 2020-08-28 (×2): 50 mg via ORAL
  Filled 2020-08-26 (×2): qty 1

## 2020-08-26 NOTE — Progress Notes (Signed)
PROGRESS NOTE    Timothy Lang   ZWC:[id number]  DOB: [birth year]  PCP: Smitty Cords, DO    DOA: 08/16/2020 LOS: 55   Brief Narrative   55 year old male with HTN, HLD, who comes into the hospital with shortness of breath.  This has been going on for the past 5 to 6 days, also has been having nonproductive cough, intermittent nausea but no vomiting. He initially tested positive for Covid on 1/2.  Has been slow to improve.     Assessment & Plan   Principal Problem:   Pneumonia due to COVID-19 virus Active Problems:   Essential hypertension   Mixed hyperlipidemia   Shortness of breath   Acute hyponatremia   Hypokalemia   Acute respiratory failure with hypoxia secondary to COVID-19 infection -present on admission. Completed 5 days remdesivir. 1/14: stable cxr, normal BNP 1/15: Patient remains on 9 to 11 L/min HFNC, BNP, some DOE  --Transition to prednisone 50 mg daily tomorrow -- Continue baricitinib -- O2 to maintain sat at or above 88%, wean as tolerated, permissive hypoxia to mid 80's with exertion is acceptable -- Supportive care: Incentive spirometry, flutter valve, prone as tolerated -- Monitor inflammatory markers, CMP, CBC  Hypotonic hyponatremia -resolved with IV fluids.  POA, likely due to dehydration and poor p.o. intake. -- Monitor BMP  Hypokalemia -replaced.  Monitor and replace further as needed.  Thrombocytosis -most likely reactive in setting of infection.  Monitor CBC.  Hypertension -continue amlodipine.  BP stable.  Elevated LFTs -likely due to COVID infection as well as remdesivir which was finished on 1/10.  LFTs are improving, AST still up.  Monitor CMP  Hyperlipidemia -statin is currently held due to elevated LFTs  Patient BMI: Body mass index is 23.69 kg/m.   DVT prophylaxis: enoxaparin (LOVENOX) injection 40 mg Start: 08/20/20 1000   Diet:  Diet Orders (From admission, onward)    Start     Ordered   08/16/20 2302  Diet  regular Room service appropriate? Yes; Fluid consistency: Thin  Diet effective now       Question Answer Comment  Room service appropriate? Yes   Fluid consistency: Thin      08/16/20 2301            Code Status: Full Code    Subjective 08/26/20    Patient reports feeling overall well today.  Was able to clear up some of his sinus congestion.  No significant shortness of breath, mild with exertion.     Disposition Plan & Communication   Status is: Inpatient  Remains inpatient appropriate because:Inpatient level of care appropriate due to severity of illness with significant supplemental oxygen requirement as above.   Dispo: The patient is from: Home              Anticipated d/c is to: Home              Anticipated d/c date is: 2 to 3 days if able to wean down oxygen              Patient currently is not medically stable to d/c.   Family Communication: None at bedside, will attempt to call   Consults, Procedures, Significant Events   Consultants:   None  Procedures:   None  Antimicrobials:  Anti-infectives (From admission, onward)   Start     Dose/Rate Route Frequency Ordered Stop   08/17/20 1000  remdesivir 100 mg in sodium chloride 0.9 % 100 mL IVPB       "  Followed by" Linked Group Details   100 mg 200 mL/hr over 30 Minutes Intravenous Daily 08/16/20 1739 08/20/20 1113   08/16/20 1830  remdesivir 200 mg in sodium chloride 0.9% 250 mL IVPB       "Followed by" Linked Group Details   200 mg 580 mL/hr over 30 Minutes Intravenous Once 08/16/20 1739 08/17/20 0207        Objective   Vitals:   08/26/20 0618 08/26/20 0758 08/26/20 1153 08/26/20 1635  BP: 118/82 130/84 104/71 124/78  Pulse: (!) 57 61 66 62  Resp: 20 20 18 16   Temp: 98.6 F (37 C) 98.3 F (36.8 C) 98.5 F (36.9 C) 98.4 F (36.9 C)  TempSrc:      SpO2: 95% 94% 96% 96%  Weight:      Height:       No intake or output data in the 24 hours ending 08/26/20 1832 Filed Weights   08/24/20  0308 08/25/20 0343 08/25/20 0600  Weight: 93.2 kg 93.3 kg 93 kg    Physical Exam:  General exam: awake, alert, no acute distress HEENT: No tenderness on palpation over maxillary or frontal sinuses bilaterally Respiratory system: normal respiratory effort at rest, on 11 L/min HFNC oxygen. Cardiovascular system: RRR, no pedal edema.   Central nervous system: A&O x3. no gross focal neurologic deficits, normal speech    Labs   Data Reviewed: I have personally reviewed following labs and imaging studies  CBC: Recent Labs  Lab 08/20/20 0459 08/21/20 0402 08/22/20 0704 08/23/20 0532 08/24/20 0430  WBC 6.8 6.9 7.0 8.5 9.6  HGB 13.9 13.9 14.3 14.7 14.2  HCT 40.4 39.7 42.3 42.0 42.7  MCV 87.4 86.7 87.6 86.2 87.7  PLT 382 421* 519* 534* 524*   Basic Metabolic Panel: Recent Labs  Lab 08/21/20 0402 08/22/20 0704 08/23/20 0532 08/24/20 0430 08/25/20 0649 08/26/20 0614  NA 137 138 139 134* 135 129*  K 4.5 4.9 4.9 4.8 4.8 5.1  CL 104 104 105 103 102 95*  CO2 21* 22 23 23 24 27   GLUCOSE 148* 161* 157* 153* 142* 126*  BUN 22* 29* 31* 24* 22* 22*  CREATININE 0.81 0.85 0.80 0.77 0.69 0.72  CALCIUM 8.3* 8.7* 8.5* 8.4* 8.4* 8.4*  MG 2.4  --   --   --   --   --    GFR: Estimated Creatinine Clearance: 136.5 mL/min (by C-G formula based on SCr of 0.72 mg/dL). Liver Function Tests: Recent Labs  Lab 08/22/20 0704 08/23/20 0532 08/24/20 0430 08/25/20 0649 08/26/20 0614  AST 48* 47* 41 34 34  ALT 158* 146* 135* 118* 136*  ALKPHOS 70 76 74 70 68  BILITOT 1.0 1.0 0.9 0.7 0.7  PROT 6.9 6.6 6.5 6.1* 6.1*  ALBUMIN 3.0* 3.0* 2.9* 2.8* 2.8*   No results for input(s): LIPASE, AMYLASE in the last 168 hours. No results for input(s): AMMONIA in the last 168 hours. Coagulation Profile: No results for input(s): INR, PROTIME in the last 168 hours. Cardiac Enzymes: No results for input(s): CKTOTAL, CKMB, CKMBINDEX, TROPONINI in the last 168 hours. BNP (last 3 results) No results for  input(s): PROBNP in the last 8760 hours. HbA1C: No results for input(s): HGBA1C in the last 72 hours. CBG: No results for input(s): GLUCAP in the last 168 hours. Lipid Profile: No results for input(s): CHOL, HDL, LDLCALC, TRIG, CHOLHDL, LDLDIRECT in the last 72 hours. Thyroid Function Tests: No results for input(s): TSH, T4TOTAL, FREET4, T3FREE, THYROIDAB in the last 72  hours. Anemia Panel: No results for input(s): VITAMINB12, FOLATE, FERRITIN, TIBC, IRON, RETICCTPCT in the last 72 hours. Sepsis Labs: No results for input(s): PROCALCITON, LATICACIDVEN in the last 168 hours.  No results found for this or any previous visit (from the past 240 hour(s)).    Imaging Studies   DG Chest Port 1 View  Result Date: 08/25/2020 CLINICAL DATA:  COVID 19 positive. Respiratory failure. Ex-smoker. Hypertension. EXAM: PORTABLE CHEST 1 VIEW COMPARISON:  08/16/2020 FINDINGS: Midline trachea. Mild cardiomegaly. No pleural effusion or pneumothorax. Low lung volumes. Peripheral predominant, left greater than right interstitial and airspace opacities. Slightly increased on the left and decreased on the right. IMPRESSION: Shifting peripheral interstitial opacities, consistent with COVID-19 pneumonia. Overall similar in severity to 08/16/2020. Electronically Signed   By: Jeronimo Greaves M.D.   On: 08/25/2020 08:29     Medications   Scheduled Meds: . baricitinib  4 mg Oral Daily  . enoxaparin (LOVENOX) injection  40 mg Subcutaneous Q24H  . fluticasone  2 spray Each Nare Daily  . Ipratropium-Albuterol  1 puff Inhalation Q6H WA  . methylPREDNISolone (SOLU-MEDROL) injection  60 mg Intravenous Q12H  . pantoprazole  40 mg Oral Daily  . [START ON 08/27/2020] predniSONE  50 mg Oral Q breakfast   Continuous Infusions:     LOS: 10 days    Time spent: 25 minutes with greater than 50% spent at bedside and in coordination of care    Pennie Banter, DO [facility]  08/26/2020, 6:32 PM    If  7PM-7AM, please contact night-coverage. How to contact the [facility] Attending or Consulting provider 7A - 7P or covering provider during after hours 7P -7A, for this patient?    1. Check the care team in [facility] and look for a) attending/consulting TRH provider listed and b) the [facility] team listed 2. Log into [email or web address] and use [place]'s universal password to access. If you do not have the password, please contact the hospital operator. 3. Locate the [facility] provider you are looking for under [facility] and page to a number that you can be directly reached. 4. If you still have difficulty reaching the provider, please page the [facility] (Director on Call) for the Hospitalists listed on amion for assistance.

## 2020-08-27 DIAGNOSIS — U071 COVID-19: Secondary | ICD-10-CM | POA: Diagnosis not present

## 2020-08-27 DIAGNOSIS — J1282 Pneumonia due to coronavirus disease 2019: Secondary | ICD-10-CM | POA: Diagnosis not present

## 2020-08-27 LAB — COMPREHENSIVE METABOLIC PANEL
ALT: 170 U/L — ABNORMAL HIGH (ref 0–44)
AST: 52 U/L — ABNORMAL HIGH (ref 15–41)
Albumin: 2.7 g/dL — ABNORMAL LOW (ref 3.5–5.0)
Alkaline Phosphatase: 64 U/L (ref 38–126)
Anion gap: 9 (ref 5–15)
BUN: 19 mg/dL (ref 6–20)
CO2: 25 mmol/L (ref 22–32)
Calcium: 8.5 mg/dL — ABNORMAL LOW (ref 8.9–10.3)
Chloride: 100 mmol/L (ref 98–111)
Creatinine, Ser: 0.77 mg/dL (ref 0.61–1.24)
GFR, Estimated: >60 mL/min (ref >60)
Glucose, Bld: 122 mg/dL — ABNORMAL HIGH (ref 70–99)
Potassium: 4.6 mmol/L (ref 3.5–5.1)
Sodium: 134 mmol/L — ABNORMAL LOW (ref 135–145)
Total Bilirubin: 0.6 mg/dL (ref 0.3–1.2)
Total Protein: 5.8 g/dL — ABNORMAL LOW (ref 6.5–8.1)

## 2020-08-27 NOTE — Progress Notes (Signed)
PROGRESS NOTE    Timothy Lang   ZOX:[id number]  DOB: 07-28-1966  PCP: Smitty Cords, DO    DOA: 08/16/2020 LOS: 59   Brief Narrative   55 year old male with HTN, HLD, who comes into the hospital with shortness of breath.  This has been going on for the past 5 to 6 days, also has been having nonproductive cough, intermittent nausea but no vomiting. He initially tested positive for Covid on 1/2.  Has been slow to improve.     Assessment & Plan   Principal Problem:   Pneumonia due to COVID-19 virus Active Problems:   Essential hypertension   Mixed hyperlipidemia   Shortness of breath   Acute hyponatremia   Hypokalemia   Acute respiratory failure with hypoxia secondary to COVID-19 infection -present on admission. Completed 5 days remdesivir. 1/14: stable cxr, normal BNP 1/15: Patient remains on 9 to 11 L/min HFNC, BNP, some DOE  1/16: weaned to 6 L/min HFNC --Prednisone 50 mg daily and taper down -- Continue baricitinib -- O2 to maintain sat at or above 88%, wean as tolerated, permissive hypoxia to mid 80's with exertion is acceptable -- Supportive care: Incentive spirometry, flutter valve, prone as tolerated -- Monitor inflammatory markers, CMP, CBC  Hypotonic hyponatremia -resolved with IV fluids.  POA, likely due to dehydration and poor p.o. intake. -- Monitor BMP  Hypokalemia -replaced.  Monitor and replace further as needed.  Thrombocytosis -most likely reactive in setting of infection.  Monitor CBC.  Hypertension -continue amlodipine.  BP stable.  Elevated LFTs -likely due to COVID infection as well as remdesivir which was finished on 1/10.  LFTs are improving, AST still up.  Monitor CMP  Hyperlipidemia -statin is currently held due to elevated LFTs  Patient BMI: Body mass index is 23.69 kg/m.   DVT prophylaxis: enoxaparin (LOVENOX) injection 40 mg Start: 08/20/20 1000   Diet:  Diet Orders (From admission, onward)    Start     Ordered    08/16/20 2302  Diet regular Room service appropriate? Yes; Fluid consistency: Thin  Diet effective now       Question Answer Comment  Room service appropriate? Yes   Fluid consistency: Thin      08/16/20 2301            Code Status: Full Code    Subjective 08/27/20    Patient standing up at sink when seen today. Reports he feels well.  Little SOB on exertion but improved.  Weaned down to 6 L oxygen.     Disposition Plan & Communication   Status is: Inpatient  Remains inpatient appropriate because:Inpatient level of care appropriate due to severity of illness with significant supplemental oxygen requirement as above.   Dispo: The patient is from: Home              Anticipated d/c is to: Home              Anticipated d/c date is: 2 to 3 days if able to wean down oxygen              Patient currently is not medically stable to d/c.   Family Communication: None at bedside, will attempt to call   Consults, Procedures, Significant Events   Consultants:   None  Procedures:   None  Antimicrobials:  Anti-infectives (From admission, onward)   Start     Dose/Rate Route Frequency Ordered Stop   08/17/20 1000  remdesivir 100 mg in sodium chloride 0.9 %  100 mL IVPB       "Followed by" Linked Group Details   100 mg 200 mL/hr over 30 Minutes Intravenous Daily 08/16/20 1739 08/20/20 1113   08/16/20 1830  remdesivir 200 mg in sodium chloride 0.9% 250 mL IVPB       "Followed by" Linked Group Details   200 mg 580 mL/hr over 30 Minutes Intravenous Once 08/16/20 1739 08/17/20 0207        Objective   Vitals:   08/27/20 0101 08/27/20 0606 08/27/20 0732 08/27/20 1130  BP: 122/76 127/85 118/84 110/74  Pulse: 68 76 (!) 57 78  Resp:  20 18 18   Temp:  98.1 F (36.7 C) 98.6 F (37 C) 99 F (37.2 C)  TempSrc:  Oral Oral Oral  SpO2:  93% 94% 92%  Weight:      Height:       No intake or output data in the 24 hours ending 08/27/20 1356 Filed Weights   08/24/20 0308 08/25/20  0343 08/25/20 0600  Weight: 93.2 kg 93.3 kg 93 kg    Physical Exam:  General exam: awake, alert, no acute distress, up at sink Respiratory system: CTAB with good aeration, no wheezes or rales, mildly increased respiratory effort standing up at sink for hygiene, on 6 L/min HFNC oxygen. Cardiovascular system: RRR, no pedal edema.   Central nervous system: A&O x3. no gross focal neurologic deficits, normal speech    Labs   Data Reviewed: I have personally reviewed following labs and imaging studies  CBC: Recent Labs  Lab 08/21/20 0402 08/22/20 0704 08/23/20 0532 08/24/20 0430  WBC 6.9 7.0 8.5 9.6  HGB 13.9 14.3 14.7 14.2  HCT 39.7 42.3 42.0 42.7  MCV 86.7 87.6 86.2 87.7  PLT 421* 519* 534* 524*   Basic Metabolic Panel: Recent Labs  Lab 08/21/20 0402 08/22/20 0704 08/23/20 0532 08/24/20 0430 08/25/20 0649 08/26/20 0614 08/27/20 0558  NA 137   < > 139 134* 135 129* 134*  K 4.5   < > 4.9 4.8 4.8 5.1 4.6  CL 104   < > 105 103 102 95* 100  CO2 21*   < > 23 23 24 27 25   GLUCOSE 148*   < > 157* 153* 142* 126* 122*  BUN 22*   < > 31* 24* 22* 22* 19  CREATININE 0.81   < > 0.80 0.77 0.69 0.72 0.77  CALCIUM 8.3*   < > 8.5* 8.4* 8.4* 8.4* 8.5*  MG 2.4  --   --   --   --   --   --    < > = values in this interval not displayed.   GFR: Estimated Creatinine Clearance: 136.5 mL/min (by C-G formula based on SCr of 0.77 mg/dL). Liver Function Tests: Recent Labs  Lab 08/23/20 0532 08/24/20 0430 08/25/20 0649 08/26/20 0614 08/27/20 0558  AST 47* 41 34 34 52*  ALT 146* 135* 118* 136* 170*  ALKPHOS 76 74 70 68 64  BILITOT 1.0 0.9 0.7 0.7 0.6  PROT 6.6 6.5 6.1* 6.1* 5.8*  ALBUMIN 3.0* 2.9* 2.8* 2.8* 2.7*   No results for input(s): LIPASE, AMYLASE in the last 168 hours. No results for input(s): AMMONIA in the last 168 hours. Coagulation Profile: No results for input(s): INR, PROTIME in the last 168 hours. Cardiac Enzymes: No results for input(s): CKTOTAL, CKMB, CKMBINDEX,  TROPONINI in the last 168 hours. BNP (last 3 results) No results for input(s): PROBNP in the last 8760 hours. HbA1C: No results  for input(s): HGBA1C in the last 72 hours. CBG: No results for input(s): GLUCAP in the last 168 hours. Lipid Profile: No results for input(s): CHOL, HDL, LDLCALC, TRIG, CHOLHDL, LDLDIRECT in the last 72 hours. Thyroid Function Tests: No results for input(s): TSH, T4TOTAL, FREET4, T3FREE, THYROIDAB in the last 72 hours. Anemia Panel: No results for input(s): VITAMINB12, FOLATE, FERRITIN, TIBC, IRON, RETICCTPCT in the last 72 hours. Sepsis Labs: No results for input(s): PROCALCITON, LATICACIDVEN in the last 168 hours.  No results found for this or any previous visit (from the past 240 hour(s)).    Imaging Studies   No results found.   Medications   Scheduled Meds: . baricitinib  4 mg Oral Daily  . enoxaparin (LOVENOX) injection  40 mg Subcutaneous Q24H  . fluticasone  2 spray Each Nare Daily  . Ipratropium-Albuterol  1 puff Inhalation Q6H WA  . pantoprazole  40 mg Oral Daily  . predniSONE  50 mg Oral Q breakfast   Continuous Infusions:     LOS: 11 days    Time spent: 20 minutes    Pennie Banter, DO [facility]  08/27/2020, 1:56 PM    If 7PM-7AM, please contact night-coverage. How to contact the [facility] Attending or Consulting provider 7A - 7P or covering provider during after hours 7P -7A, for this patient?    1. Check the care team in [facility] and look for a) attending/consulting TRH provider listed and b) the [facility] team listed 2. Log into [email or web address] and use [place]'s universal password to access. If you do not have the password, please contact the hospital operator. 3. Locate the [facility] provider you are looking for under [facility] and page to a number that you can be directly reached. 4. If you still have difficulty reaching the provider, please page the [facility] (Director on Call) for the Hospitalists listed on amion for  assistance.

## 2020-08-28 LAB — COMPREHENSIVE METABOLIC PANEL
ALT: 204 U/L — ABNORMAL HIGH (ref 0–44)
AST: 52 U/L — ABNORMAL HIGH (ref 15–41)
Albumin: 2.6 g/dL — ABNORMAL LOW (ref 3.5–5.0)
Alkaline Phosphatase: 59 U/L (ref 38–126)
Anion gap: 8 (ref 5–15)
BUN: 24 mg/dL — ABNORMAL HIGH (ref 6–20)
CO2: 27 mmol/L (ref 22–32)
Calcium: 8.4 mg/dL — ABNORMAL LOW (ref 8.9–10.3)
Chloride: 102 mmol/L (ref 98–111)
Creatinine, Ser: 0.78 mg/dL (ref 0.61–1.24)
GFR, Estimated: >60 mL/min (ref >60)
Glucose, Bld: 80 mg/dL (ref 70–99)
Potassium: 4.2 mmol/L (ref 3.5–5.1)
Sodium: 137 mmol/L (ref 135–145)
Total Bilirubin: 0.7 mg/dL (ref 0.3–1.2)
Total Protein: 5.6 g/dL — ABNORMAL LOW (ref 6.5–8.1)

## 2020-08-28 LAB — C-REACTIVE PROTEIN: CRP: 0.5 mg/dL (ref <1.0)

## 2020-08-28 MED ORDER — FLUTICASONE PROPIONATE 50 MCG/ACT NA SUSP: 2.0000 | Freq: Every day | NASAL | 1 refills | Status: DC

## 2020-08-28 MED ORDER — PREDNISONE 10 MG PO TABS: ORAL_TABLET | ORAL | 0 refills | Status: DC

## 2020-08-28 MED ORDER — ALBUTEROL SULFATE HFA 108 (90 BASE) MCG/ACT IN AERS: 1.0000 | INHALATION_SPRAY | RESPIRATORY_TRACT | 1 refills | Status: AC | PRN

## 2020-08-28 NOTE — Progress Notes (Signed)
SATURATION QUALIFICATIONS: (This note is used to comply with regulatory documentation for home oxygen)  Patient Saturations on Room Air at Rest = 94%  Patient Saturations on Room Air while Ambulating = 88%  Bo Mcclintock, RN

## 2020-08-28 NOTE — Progress Notes (Signed)
Patient reports a full-night sleep. On 3.5L. Planning to do oxygen qualification for discharge. No other changes from previously documented assessment. Bo Mcclintock, RN

## 2020-08-28 NOTE — Discharge Summary (Signed)
Physician Discharge Summary  Timothy Lang ZOX:[id number] DOB: 1965/11/02 DOA: 08/16/2020  PCP: Smitty Cords, DO  Admit date: 08/16/2020 Discharge date: 08/28/2020  Admitted From: home Disposition:  home  Recommendations for Outpatient Follow-up:  1. Follow up with PCP in 1-2 weeks 2. Please obtain BMP/CBC in one week  Home Health: No Equipment/Devices: None  Discharge Condition: Stable CODE STATUS: Full Diet recommendation: Heart Healthy    Discharge Diagnoses: Principal Problem:   Pneumonia due to COVID-19 virus Active Problems:   Essential hypertension   Mixed hyperlipidemia   Shortness of breath   Acute hyponatremia   Hypokalemia    Summary of HPI and Hospital Course:  55 year old male with HTN, HLD, who comes into the hospital with shortness of breath. This has been going on for the past 5 to 6 days, also has been having nonproductive cough, intermittent nausea but no vomiting. He initially tested positive for Covid on 1/2. Admitted to the hospital for management of Covid-19 pneumonia with respiratory failure due to hypoxia.        Acute respiratory failure with hypoxia secondary to COVID-19 infection -present on admission.  Completed 5 days remdesivir and IV steroids, transitioned to prednisone with short taper at discharge prescribed.  Treated with baricitinib during admission as well.  Supportive care with incentive spirometry, flutter valve, prone as tolerated Patient was slow to improve with persistent requirement for supplemental oxygen until day of discharge, due to desaturations with exertion and DOE.   Close PCP follow up.  Hypotonic hyponatremia -resolved with IV fluids.  POA, likely due to dehydration and poor p.o. intake.  BMP in follow up.  Hypokalemia -replaced.  BMP in follow up.  Thrombocytosis -most likely reactive in setting of infection.  Improving.  CBC in follow up.  Hypertension -continue amlodipine.  BP stable.  Elevated  LFTs -likely due to COVID infection as well as remdesivir which was finished on 1/10.  LFTs were trending down, but increased a bit past few days.  CMP in close follow up.    Hyperlipidemia -statin is currently held due to elevated LFTs    Discharge Instructions   Discharge Instructions    Call MD for:   Complete by: As directed    Persistent and worsening shortness of breath if you are unable to recover with rest.   Call MD for:  extreme fatigue   Complete by: As directed    Call MD for:  persistant dizziness or light-headedness   Complete by: As directed    Call MD for:  severe uncontrolled pain   Complete by: As directed    Call MD for:  temperature >100.4   Complete by: As directed    Diet - low sodium heart healthy   Complete by: As directed    Discharge instructions   Complete by: As directed    Increase physical activity slowly, and as tolerated.    Use albuterol inhaler in you're feeling short of breath or wheezing.  Take prednisone taper over 4 days - 40 mg / 30 mg / 20 mg / 10 mg - one day each dose.  When short of breath with activity, sit down to rest for awhile and recover.   Your oxygen levels were good on room air today, you don't need to go home with oxygen.   Increase activity slowly   Complete by: As directed      Allergies as of 08/28/2020      Reactions   Penicillins Other (See Comments)  Reason unknown, since a child.      Medication List    STOP taking these medications   amLODipine 10 MG tablet Commonly known as: NORVASC   ASPIRIN 81 PO   esomeprazole 20 MG packet Commonly known as: NEXIUM     TAKE these medications   albuterol 108 (90 Base) MCG/ACT inhaler Commonly known as: VENTOLIN HFA Inhale 1-2 puffs into the lungs every 4 (four) hours as needed for wheezing or shortness of breath.   benzonatate 200 MG capsule Commonly known as: TESSALON Take 1 capsule (200 mg total) by mouth 3 (three) times daily as needed for cough.    fluticasone 50 MCG/ACT nasal spray Commonly known as: FLONASE Place 2 sprays into both nostrils daily. Start taking on: August 29, 2020   ketorolac 10 MG tablet Commonly known as: TORADOL Take 1 tablet (10 mg total) by mouth every 6 (six) hours as needed for moderate pain or severe pain.   montelukast 10 MG tablet Commonly known as: SINGULAIR TAKE 1 TABLET(10 MG) BY MOUTH AT BEDTIME   predniSONE 10 MG tablet Commonly known as: DELTASONE Take 4 tablets (40 mg total) by mouth daily for 1 day, THEN 3 tablets (30 mg total) daily for 1 day, THEN 2 tablets (20 mg total) daily for 1 day, THEN 1 tablet (10 mg total) daily for 1 day. Start taking on: August 28, 2020   rosuvastatin 10 MG tablet Commonly known as: Crestor Take 1 tablet (10 mg total) by mouth at bedtime.   sildenafil 20 MG tablet Commonly known as: REVATIO Take 1-5 pills about 30 min prior to sex. Start with 1 and increase as needed.       Allergies  Allergen Reactions  . Penicillins Other (See Comments)    Reason unknown, since a child.    Consultations:  None   Procedures/Studies: DG Chest 2 View  Result Date: 08/16/2020 CLINICAL DATA:  55 year old male with history of shortness of breath. Recently diagnosed with COVID infection. EXAM: CHEST - 2 VIEW COMPARISON:  No priors. FINDINGS: Lung volumes are low. Patchy multifocal airspace disease and areas of interstitial prominence throughout the lungs bilaterally, most evident throughout the mid to lower lungs. No pneumothorax. No evidence of pulmonary edema. No pleural effusions. Heart size is normal. Upper mediastinal contours are within normal limits. IMPRESSION: 1. Severe multilobar bilateral pneumonia compatible with reported COVID infection, as above. Electronically Signed   By: Trudie Reed M.D.   On: 08/16/2020 13:42   DG Chest Port 1 View  Result Date: 08/25/2020 CLINICAL DATA:  COVID 19 positive. Respiratory failure. Ex-smoker. Hypertension. EXAM:  PORTABLE CHEST 1 VIEW COMPARISON:  08/16/2020 FINDINGS: Midline trachea. Mild cardiomegaly. No pleural effusion or pneumothorax. Low lung volumes. Peripheral predominant, left greater than right interstitial and airspace opacities. Slightly increased on the left and decreased on the right. IMPRESSION: Shifting peripheral interstitial opacities, consistent with COVID-19 pneumonia. Overall similar in severity to 08/16/2020. Electronically Signed   By: Jeronimo Greaves M.D.   On: 08/25/2020 08:29       Subjective: Pt feels well. Ambulating around room on room air, tolerating well and maintaining O2 sats.  Feels ready to get home.  Still fatigues easily, we talked about slowly increasing activity.   Discharge Exam: Vitals:   08/28/20 0947 08/28/20 1347  BP: 112/67   Pulse: 74 80  Resp: 20   Temp: 98.8 F (37.1 C)   SpO2: 96% 95%   Vitals:   08/27/20 1959 08/28/20 0556 08/28/20 0093  08/28/20 1347  BP: 115/74 116/79 112/67   Pulse: 71 (!) 58 74 80  Resp: 18 18 20    Temp: 98.7 F (37.1 C) 97.7 F (36.5 C) 98.8 F (37.1 C)   TempSrc: Oral Oral Oral   SpO2: 97% 94% 96% 95%  Weight:      Height:        General: Pt is alert, awake, not in acute distress Cardiovascular: RRR, S1/S2 +, no rubs, no gallops Respiratory: CTA bilaterally, no wheezing, no rhonchi Abdominal: Soft, NT, ND, bowel sounds + Extremities: no edema, no cyanosis    The results of significant diagnostics from this hospitalization (including imaging, microbiology, ancillary and laboratory) are listed below for reference.     Microbiology: No results found for this or any previous visit (from the past 240 hour(s)).   Labs: BNP (last 3 results) Recent Labs    08/25/20 0649  BNP 43.2   Basic Metabolic Panel: Recent Labs  Lab 08/24/20 0430 08/25/20 0649 08/26/20 0614 08/27/20 0558 08/28/20 0729  NA 134* 135 129* 134* 137  K 4.8 4.8 5.1 4.6 4.2  CL 103 102 95* 100 102  CO2 23 24 27 25 27   GLUCOSE 153*  142* 126* 122* 80  BUN 24* 22* 22* 19 24*  CREATININE 0.77 0.69 0.72 0.77 0.78  CALCIUM 8.4* 8.4* 8.4* 8.5* 8.4*   Liver Function Tests: Recent Labs  Lab 08/24/20 0430 08/25/20 0649 08/26/20 0614 08/27/20 0558 08/28/20 0729  AST 41 34 34 52* 52*  ALT 135* 118* 136* 170* 204*  ALKPHOS 74 70 68 64 59  BILITOT 0.9 0.7 0.7 0.6 0.7  PROT 6.5 6.1* 6.1* 5.8* 5.6*  ALBUMIN 2.9* 2.8* 2.8* 2.7* 2.6*   No results for input(s): LIPASE, AMYLASE in the last 168 hours. No results for input(s): AMMONIA in the last 168 hours. CBC: Recent Labs  Lab 08/22/20 0704 08/23/20 0532 08/24/20 0430  WBC 7.0 8.5 9.6  HGB 14.3 14.7 14.2  HCT 42.3 42.0 42.7  MCV 87.6 86.2 87.7  PLT 519* 534* 524*   Cardiac Enzymes: No results for input(s): CKTOTAL, CKMB, CKMBINDEX, TROPONINI in the last 168 hours. BNP: Invalid input(s): POCBNP CBG: No results for input(s): GLUCAP in the last 168 hours. D-Dimer No results for input(s): DDIMER in the last 72 hours. Hgb A1c No results for input(s): HGBA1C in the last 72 hours. Lipid Profile No results for input(s): CHOL, HDL, LDLCALC, TRIG, CHOLHDL, LDLDIRECT in the last 72 hours. Thyroid function studies No results for input(s): TSH, T4TOTAL, T3FREE, THYROIDAB in the last 72 hours.  Invalid input(s): FREET3 Anemia work up No results for input(s): VITAMINB12, FOLATE, FERRITIN, TIBC, IRON, RETICCTPCT in the last 72 hours. Urinalysis    Component Value Date/Time   COLORURINE YELLOW (A) 08/16/2020 1259   APPEARANCEUR CLEAR (A) 08/16/2020 1259   LABSPEC 1.009 08/16/2020 1259   PHURINE 6.0 08/16/2020 1259   GLUCOSEU NEGATIVE 08/16/2020 1259   HGBUR SMALL (A) 08/16/2020 1259   BILIRUBINUR NEGATIVE 08/16/2020 1259   KETONESUR 5 (A) 08/16/2020 1259   PROTEINUR 100 (A) 08/16/2020 1259   NITRITE NEGATIVE 08/16/2020 1259   LEUKOCYTESUR NEGATIVE 08/16/2020 1259   Sepsis Labs Invalid input(s): PROCALCITONIN,  WBC,  LACTICIDVEN Microbiology No results found  for this or any previous visit (from the past 240 hour(s)).   Time coordinating discharge: Over 30 minutes  SIGNED:   10/14/2020, DO [facility] 08/28/2020, 2:01 PM   If 7PM-7AM, please contact night-coverage [email or web address]

## 2020-08-28 NOTE — Progress Notes (Signed)
Patient discharged home on room air. Instructions given and all questions answered. Bo Mcclintock, RN

## 2020-09-01 ENCOUNTER — Other Ambulatory Visit: Payer: Self-pay

## 2020-09-01 ENCOUNTER — Encounter: Payer: Self-pay | Admitting: Family Medicine

## 2020-09-01 ENCOUNTER — Telehealth: Payer: Self-pay | Admitting: Family Medicine

## 2020-09-01 ENCOUNTER — Telehealth (INDEPENDENT_AMBULATORY_CARE_PROVIDER_SITE_OTHER): Payer: BC Managed Care – PPO | Admitting: Family Medicine

## 2020-09-01 VITALS — Temp 98.6°F

## 2020-09-01 DIAGNOSIS — J029 Acute pharyngitis, unspecified: Secondary | ICD-10-CM | POA: Diagnosis not present

## 2020-09-01 MED ORDER — AMOXICILLIN 500 MG PO CAPS: 500.0000 mg | ORAL_CAPSULE | Freq: Two times a day (BID) | ORAL | 0 refills | Status: DC

## 2020-09-01 MED ORDER — MAGIC MOUTHWASH W/LIDOCAINE: ORAL | 0 refills | Status: DC

## 2020-09-01 NOTE — Telephone Encounter (Signed)
error:[id number] ° °

## 2020-09-01 NOTE — Progress Notes (Signed)
Virtual Visit via Telephone The purpose of this virtual visit is to provide medical care while limiting exposure to the novel coronavirus (COVID19) for both patient and office staff.  Consent was obtained for phone visit:  Yes.   Answered questions that patient had about telehealth interaction:  Yes.   I discussed the limitations, risks, security and privacy concerns of performing an evaluation and management service by telephone. I also discussed with the patient that there may be a patient responsible charge related to this service. The patient expressed understanding and agreed to proceed.  Patient Location: Home Provider Location: [facility] (Office)  Participants in virtual visit: - Patient: Timothy Lang - CMA: Laurel Dimmer, CMA - Provider: Dr Althea Charon  ---------------------------------------------------------------------- Chief Complaint  Patient presents with  . Sore Throat    Severe sore throat, difficulty swallowing, Erythema w/ white patches in the back of the throat. X 3 days. Pt been trying to treat the symptoms with gargling with salty warm water.      S: Reviewed CMA documentation. I have called patient and gathered additional HPI as follows:  SORE THROAT / PHARYNGITIS Recent course 08/16/20 hospitalized for COVID, has since resolved. Now new problem in past 3 days with sore throat with redness swelling pain and white patches. He has had similar before 1-2 year ago white patches, used antibiotic and mouthwash  Denies any known or suspected exposure to person with or possibly with COVID19.  Denies any fevers, chills, sweats, body ache, cough, shortness of breath, sinus pain or pressure, headache, abdominal pain, diarrhea  Past Medical History:  Diagnosis Date  . Allergic rhinitis   . HLD (hyperlipidemia)   . HTN (hypertension)    Social History   Tobacco Use  . Smoking status: Former Smoker    Packs/day: 0.50    Types: Cigarettes    Quit  date: 04/15/2016    Years since quitting: 4.3  . Smokeless tobacco: Former Clinical biochemist  . Vaping Use: Never used  Substance Use Topics  . Alcohol use: No  . Drug use: No    Current Outpatient Medications:  .  albuterol (VENTOLIN HFA) 108 (90 Base) MCG/ACT inhaler, Inhale 1-2 puffs into the lungs every 4 (four) hours as needed for wheezing or shortness of breath., Disp: 6.7 g, Rfl: 1 .  amLODipine (NORVASC) 10 MG tablet, Take 10 mg by mouth daily., Disp: , Rfl:  .  amoxicillin (AMOXIL) 500 MG capsule, Take 1 capsule (500 mg total) by mouth 2 (two) times daily. For 10 days, Disp: 20 capsule, Rfl: 0 .  fluticasone (FLONASE) 50 MCG/ACT nasal spray, Place 2 sprays into both nostrils daily., Disp: 9.9 mL, Rfl: 1 .  magic mouthwash w/lidocaine SOLN, Swish, gargle, and spit one to two teaspoonfuls every six hours as needed. Shake well before using., Disp: 120 mL, Rfl: 0 .  montelukast (SINGULAIR) 10 MG tablet, TAKE 1 TABLET(10 MG) BY MOUTH AT BEDTIME, Disp: 90 tablet, Rfl: 0 .  rosuvastatin (CRESTOR) 10 MG tablet, Take 1 tablet (10 mg total) by mouth at bedtime., Disp: 90 tablet, Rfl: 3 .  sildenafil (REVATIO) 20 MG tablet, Take 1-5 pills about 30 min prior to sex. Start with 1 and increase as needed. (Patient not taking: Reported on 09/01/2020), Disp: 90 tablet, Rfl: 2  Depression screen [facility] 2/9 05/23/2020 11/15/2019 03/23/2019  Decreased Interest 0 0 0  Down, Depressed, Hopeless 0 0 0  PHQ - 2 Score 0 0 0    No  flowsheet data found.  -------------------------------------------------------------------------- O: No physical exam performed due to remote telephone encounter.  Lab results reviewed.  Recent Results (from the past 2160 hour(s))  SARS CORONAVIRUS 2 (TAT 6-24 HRS) Nasopharyngeal Nasopharyngeal Swab     Status: Abnormal   Collection Time: 08/13/20  5:14 PM   Specimen: Nasopharyngeal Swab  Result Value Ref Range   SARS Coronavirus 2 POSITIVE (A) NEGATIVE    Comment: EMAILED  MELISSA BROWNING 0541 08/14/2020 T. TYSOR (NOTE) SARS-CoV-2 target nucleic acids are DETECTED.  The SARS-CoV-2 RNA is generally detectable in upper and lower respiratory specimens during the acute phase of infection. Positive results are indicative of the presence of SARS-CoV-2 RNA. Clinical correlation with patient history and other diagnostic information is  necessary to determine patient infection status. Positive results do not rule out bacterial infection or co-infection with other viruses.  The expected result is Negative.  Fact Sheet for Patients: HairSlick.no  Fact Sheet for Healthcare Providers: quierodirigir.com  This test is not yet approved or cleared by the [place] FDA and  has been authorized for detection and/or diagnosis of SARS-CoV-2 by FDA under an Emergency Use Authorization (EUA). This EUA will remain  in effect (meaning this test can be used) for the duration of the COVID-19 de claration under Section 564(b)(1) of the Act, 21 U.S.C. section 360bbb-3(b)(1), unless the authorization is terminated or revoked sooner.   Performed at [facility], [address], [place], [place] [address]   Urinalysis, Complete w Microscopic     Status: Abnormal   Collection Time: 08/16/20 12:59 PM  Result Value Ref Range   Color, Urine YELLOW (A) YELLOW   APPearance CLEAR (A) CLEAR   Specific Gravity, Urine 1.009 1.005 - 1.030   pH 6.0 5.0 - 8.0   Glucose, UA NEGATIVE NEGATIVE mg/dL   Hgb urine dipstick SMALL (A) NEGATIVE   Bilirubin Urine NEGATIVE NEGATIVE   Ketones, ur 5 (A) NEGATIVE mg/dL   Protein, ur 604 (A) NEGATIVE mg/dL   Nitrite NEGATIVE NEGATIVE   Leukocytes,Ua NEGATIVE NEGATIVE   RBC / HPF 0-5 0 - 5 RBC/hpf   WBC, UA 0-5 0 - 5 WBC/hpf   Bacteria, UA NONE SEEN NONE SEEN   Squamous Epithelial / LPF 0-5 0 - 5    Comment: Performed at [facility], [address] [place], [place]  [address]  Lactic acid, plasma     Status: None   Collection Time: 08/16/20  1:00 PM  Result Value Ref Range   Lactic Acid, Venous 1.3 0.5 - 1.9 mmol/L    Comment: Performed at [facility], [address] [place], [place] [address]  Comprehensive metabolic panel     Status: Abnormal   Collection Time: 08/16/20  1:00 PM  Result Value Ref Range   Sodium 127 (L) 135 - 145 mmol/L   Potassium 3.4 (L) 3.5 - 5.1 mmol/L   Chloride 93 (L) 98 - 111 mmol/L   CO2 19 (L) 22 - 32 mmol/L   Glucose, Bld 108 (H) 70 - 99 mg/dL    Comment: Glucose reference range applies only to samples taken after fasting for at least 8 hours.   BUN 16 6 - 20 mg/dL   Creatinine, Ser 7.82 0.61 - 1.24 mg/dL   Calcium 8.5 (L) 8.9 - 10.3 mg/dL   Total Protein 7.7 6.5 - 8.1 g/dL   Albumin 3.7 3.5 - 5.0 g/dL   AST 55 (H) 15 - 41 U/L   ALT 34  0 - 44 U/L   Alkaline Phosphatase 69 38 - 126 U/L   Total Bilirubin 0.9 0.3 - 1.2 mg/dL   GFR, Estimated >09 >38 mL/min    Comment: (NOTE) Calculated using the CKD-EPI Creatinine Equation (2021)    Anion gap 15 5 - 15    Comment: Performed at [facility], [address] [place], [place] [address]  CBC with Differential     Status: None   Collection Time: 08/16/20  1:00 PM  Result Value Ref Range   WBC 5.2 4.0 - 10.5 K/uL   RBC 4.77 4.22 - 5.81 MIL/uL   Hemoglobin 14.1 13.0 - 17.0 g/dL   HCT 37.1 69.6 - 78.9 %   MCV 84.5 80.0 - 100.0 fL   MCH 29.6 26.0 - 34.0 pg   MCHC 35.0 30.0 - 36.0 g/dL   RDW 38.1 01.7 - 51.0 %   Platelets 206 150 - 400 K/uL   nRBC 0.0 0.0 - 0.2 %   Neutrophils Relative % 79 %   Neutro Abs 4.1 1.7 - 7.7 K/uL   Lymphocytes Relative 16 %   Lymphs Abs 0.8 0.7 - 4.0 K/uL   Monocytes Relative 4 %   Monocytes Absolute 0.2 0.1 - 1.0 K/uL   Eosinophils Relative 0 %   Eosinophils Absolute 0.0 0.0 - 0.5 K/uL   Basophils Relative 0 %   Basophils Absolute 0.0 0.0 - 0.1 K/uL   Immature Granulocytes 1 %   Abs Immature Granulocytes 0.05 0.00  - 0.07 K/uL    Comment: Performed at [facility], [address] [place], [place] [address]  Procalcitonin     Status: None   Collection Time: 08/16/20  6:00 PM  Result Value Ref Range   Procalcitonin 0.15 ng/mL    Comment:        Interpretation: PCT (Procalcitonin) <= 0.5 ng/mL: Systemic infection (sepsis) is not likely. Local bacterial infection is possible. (NOTE)       Sepsis PCT Algorithm           Lower Respiratory Tract                                      Infection PCT Algorithm    ----------------------------     ----------------------------         PCT < 0.25 ng/mL                PCT < 0.10 ng/mL          Strongly encourage             Strongly discourage   discontinuation of antibiotics    initiation of antibiotics    ----------------------------     -----------------------------       PCT 0.25 - 0.50 ng/mL            PCT 0.10 - 0.25 ng/mL               OR       >80% decrease in PCT            Discourage initiation of                                            antibiotics      Encourage discontinuation  of antibiotics    ----------------------------     -----------------------------         PCT >= 0.50 ng/mL              PCT 0.26 - 0.50 ng/mL               AND        <80% decrease in PCT             Encourage initiation of                                             antibiotics       Encourage continuation           of antibiotics    ----------------------------     -----------------------------        PCT >= 0.50 ng/mL                  PCT > 0.50 ng/mL               AND         increase in PCT                  Strongly encourage                                      initiation of antibiotics    Strongly encourage escalation           of antibiotics                                     -----------------------------                                           PCT <= 0.25 ng/mL                                                 OR                                         > 80% decrease in PCT                                      Discontinue / Do not initiate                                             antibiotics  Performed at [facility], [address] [place], [place] [address]   Lactate dehydrogenase     Status: Abnormal   Collection Time: 08/16/20  6:00 PM  Result Value Ref Range   LDH 421 (H) 98 - 192 U/L  Comment: Performed at [facility], [address] [place], [place] [address]  Ferritin     Status: Abnormal   Collection Time: 08/16/20  6:00 PM  Result Value Ref Range   Ferritin 1,023 (H) 24 - 336 ng/mL    Comment: Performed at [facility], [address] [place], [place] [address]  Triglycerides     Status: None   Collection Time: 08/16/20  6:00 PM  Result Value Ref Range   Triglycerides 101 <150 mg/dL    Comment: Performed at [facility], [address] [place], [place] [address]  Fibrinogen     Status: Abnormal   Collection Time: 08/16/20  6:00 PM  Result Value Ref Range   Fibrinogen 737 (H) 210 - 475 mg/dL    Comment: Performed at [facility], [address] [place], [place] [address]  C-reactive protein     Status: Abnormal   Collection Time: 08/16/20  6:00 PM  Result Value Ref Range   CRP 18.0 (H) <1.0 mg/dL    Comment: Performed at [facility], [address] [place], [place] [address]  Troponin I (High Sensitivity)     Status: Abnormal   Collection Time: 08/16/20  6:00 PM  Result Value Ref Range   Troponin I (High Sensitivity) 18 (H) <18 ng/L    Comment: (NOTE) Elevated high sensitivity troponin I (hsTnI) values and significant  changes across serial measurements may suggest ACS but many other  chronic and acute conditions are known to elevate hsTnI results.  Refer to the "Links" section for chest pain algorithms and additional  guidance. Performed at [facility], [address] [place], [place] [address]   D-dimer,  quantitative (not at [facility])     Status: Abnormal   Collection Time: 08/16/20  6:00 PM  Result Value Ref Range   D-Dimer, Quant 1.14 (H) 0.00 - 0.50 ug/mL-FEU    Comment: (NOTE) At the manufacturer cut-off value of 0.5 g/mL FEU, this assay has a negative predictive value of 95-100%.This assay is intended for use in conjunction with a clinical pretest probability (PTP) assessment model to exclude pulmonary embolism (PE) and deep venous thrombosis (DVT) in outpatients suspected of PE or DVT. Results should be correlated with clinical presentation. Performed at [facility], [address] [place], [place] [address]   Magnesium     Status: None   Collection Time: 08/16/20  6:00 PM  Result Value Ref Range   Magnesium 2.3 1.7 - 2.4 mg/dL    Comment: Performed at [facility], [address] [place], [place] [address]  Procalcitonin     Status: None   Collection Time: 08/16/20  6:00 PM  Result Value Ref Range   Procalcitonin 0.21 ng/mL    Comment:        Interpretation: PCT (Procalcitonin) <= 0.5 ng/mL: Systemic infection (sepsis) is not likely. Local bacterial infection is possible. (NOTE)       Sepsis PCT Algorithm           Lower Respiratory Tract                                      Infection PCT Algorithm    ----------------------------     ----------------------------         PCT < 0.25 ng/mL  PCT < 0.10 ng/mL          Strongly encourage             Strongly discourage   discontinuation of antibiotics    initiation of antibiotics    ----------------------------     -----------------------------       PCT 0.25 - 0.50 ng/mL            PCT 0.10 - 0.25 ng/mL               OR       >80% decrease in PCT            Discourage initiation of                                            antibiotics      Encourage discontinuation           of antibiotics    ----------------------------     -----------------------------         PCT >= 0.50 ng/mL               PCT 0.26 - 0.50 ng/mL               AND        <80% decrease in PCT             Encourage initiation of                                             antibiotics       Encourage continuation           of antibiotics    ----------------------------     -----------------------------        PCT >= 0.50 ng/mL                  PCT > 0.50 ng/mL               AND         increase in PCT                  Strongly encourage                                      initiation of antibiotics    Strongly encourage escalation           of antibiotics                                     -----------------------------                                           PCT <= 0.25 ng/mL                                                 OR                                        >  80% decrease in PCT                                      Discontinue / Do not initiate                                             antibiotics  Performed at [facility], [address] [place], [place] [address]   Sodium, urine, random     Status: None   Collection Time: 08/16/20  6:00 PM  Result Value Ref Range   Sodium, Ur <10 mmol/L    Comment: Performed at [facility], [address] [place], [place] [address]  Osmolality, urine     Status: Abnormal   Collection Time: 08/16/20  6:00 PM  Result Value Ref Range   Osmolality, Ur 291 (L) 300 - 900 mOsm/kg    Comment: Performed at [facility], [address] [place], [place] [address]  Osmolality     Status: None   Collection Time: 08/17/20 12:07 AM  Result Value Ref Range   Osmolality 276 275 - 295 mOsm/kg    Comment: Performed at [facility], [address] [place], [place] [address]  HIV Antibody (routine testing w rflx)     Status: None   Collection Time: 08/17/20  5:56 AM  Result Value Ref Range   HIV Screen 4th Generation wRfx Non Reactive Non Reactive    Comment: Performed at [facility], [address]  [place], [place] [address]  Comprehensive metabolic panel     Status: Abnormal   Collection Time: 08/17/20  5:56 AM  Result Value Ref Range   Sodium 135 135 - 145 mmol/L    Comment: RESULTS VERIFIED BY REPEAT TESTING   Potassium 3.9 3.5 - 5.1 mmol/L   Chloride 103 98 - 111 mmol/L   CO2 23 22 - 32 mmol/L   Glucose, Bld 149 (H) 70 - 99 mg/dL    Comment: Glucose reference range applies only to samples taken after fasting for at least 8 hours.   BUN 20 6 - 20 mg/dL   Creatinine, Ser 1.30 0.61 - 1.24 mg/dL   Calcium 8.0 (L) 8.9 - 10.3 mg/dL   Total Protein 6.8 6.5 - 8.1 g/dL   Albumin 3.1 (L) 3.5 - 5.0 g/dL   AST 67 (H) 15 - 41 U/L   ALT 41 0 - 44 U/L   Alkaline Phosphatase 62 38 - 126 U/L   Total Bilirubin 0.7 0.3 - 1.2 mg/dL   GFR, Estimated >86 >57 mL/min    Comment: (NOTE) Calculated using the CKD-EPI Creatinine Equation (2021)    Anion gap 9 5 - 15    Comment: Performed at [facility], [address] [place], [place] [address]  Magnesium     Status: Abnormal   Collection Time: 08/17/20  5:56 AM  Result Value Ref Range   Magnesium 2.5 (H) 1.7 - 2.4 mg/dL    Comment: Performed at [facility], [address] [place], [place] [address]  Phosphorus     Status: None   Collection Time: 08/17/20  5:56 AM  Result Value Ref Range   Phosphorus 3.7 2.5 - 4.6 mg/dL    Comment: Performed at [facility], [address] [place], [place] [address]  CBC with Differential/Platelet     Status: Abnormal   Collection Time: 08/17/20  5:56 AM  Result Value Ref Range   WBC 3.7 (L) 4.0 - 10.5 K/uL   RBC 4.55 4.22 - 5.81 MIL/uL   Hemoglobin 13.7 13.0 - 17.0 g/dL   HCT 60.4 (L) 54.0 - 98.1 %   MCV 85.5 80.0 - 100.0 fL   MCH 30.1 26.0 - 34.0 pg   MCHC 35.2 30.0 - 36.0 g/dL   RDW 19.1 47.8 - 29.5 %   Platelets 230 150 - 400 K/uL   nRBC 0.0 0.0 - 0.2 %   Neutrophils Relative % 77 %   Neutro Abs 2.8 1.7 - 7.7 K/uL   Lymphocytes Relative 19 %   Lymphs Abs 0.7 0.7 -  4.0 K/uL   Monocytes Relative 3 %   Monocytes Absolute 0.1 0.1 - 1.0 K/uL   Eosinophils Relative 0 %   Eosinophils Absolute 0.0 0.0 - 0.5 K/uL   Basophils Relative 0 %   Basophils Absolute 0.0 0.0 - 0.1 K/uL   Immature Granulocytes 1 %   Abs Immature Granulocytes 0.03 0.00 - 0.07 K/uL    Comment: Performed at [facility], [address] [place], [place] [address]  C-reactive protein     Status: Abnormal   Collection Time: 08/17/20  5:56 AM  Result Value Ref Range   CRP 16.6 (H) <1.0 mg/dL    Comment: Performed at [facility], [address], [place], [place] [address]  Ferritin     Status: Abnormal   Collection Time: 08/17/20  5:56 AM  Result Value Ref Range   Ferritin 1,100 (H) 24 - 336 ng/mL    Comment: Performed at [facility], [address] [place], [place] [address]  Fibrinogen     Status: Abnormal   Collection Time: 08/17/20  5:56 AM  Result Value Ref Range   Fibrinogen 657 (H) 210 - 475 mg/dL    Comment: Performed at [facility], [address] [place], [place] [address]  Lactate dehydrogenase     Status: Abnormal   Collection Time: 08/17/20  5:56 AM  Result Value Ref Range   LDH 385 (H) 98 - 192 U/L    Comment: Performed at [facility], [address] [place], [place] [address]  TSH     Status: None   Collection Time: 08/17/20  5:56 AM  Result Value Ref Range   TSH 0.508 0.350 - 4.500 uIU/mL    Comment: Performed by a 3rd Generation assay with a functional sensitivity of <=0.01 uIU/mL. Performed at [facility], [address] [place], [place] [address]   D-dimer, quantitative (not at [facility])     Status: Abnormal   Collection Time: 08/17/20  5:56 AM  Result Value Ref Range   D-Dimer, Quant 1.10 (H) 0.00 - 0.50 ug/mL-FEU    Comment: (NOTE) At the manufacturer cut-off value of 0.5 g/mL FEU, this assay has a negative predictive value of 95-100%.This assay is intended for use in conjunction with a  clinical pretest probability (PTP) assessment model to exclude pulmonary embolism (PE) and deep venous thrombosis (DVT) in outpatients suspected of PE or DVT. Results should be correlated with clinical presentation. Performed at [facility], [address] [place], [place] [address]   Comprehensive metabolic panel     Status: Abnormal   Collection Time: 08/18/20  5:48 AM  Result Value Ref Range   Sodium  138 135 - 145 mmol/L   Potassium 4.0 3.5 - 5.1 mmol/L   Chloride 107 98 - 111 mmol/L   CO2 20 (L) 22 - 32 mmol/L   Glucose, Bld 150 (H) 70 - 99 mg/dL    Comment: Glucose reference range applies only to samples taken after fasting for at least 8 hours.   BUN 24 (H) 6 - 20 mg/dL   Creatinine, Ser 1.610.71 0.61 - 1.24 mg/dL   Calcium 8.3 (L) 8.9 - 10.3 mg/dL   Total Protein 6.6 6.5 - 8.1 g/dL   Albumin 2.9 (L) 3.5 - 5.0 g/dL   AST 69 (H) 15 - 41 U/L   ALT 56 (H) 0 - 44 U/L   Alkaline Phosphatase 60 38 - 126 U/L   Total Bilirubin 0.7 0.3 - 1.2 mg/dL   GFR, Estimated >09>60 >60>60 mL/min    Comment: (NOTE) Calculated using the CKD-EPI Creatinine Equation (2021)    Anion gap 11 5 - 15    Comment: Performed at [facility], [address] [place], [place] [address]  CBC     Status: None   Collection Time: 08/18/20  5:48 AM  Result Value Ref Range   WBC 7.4 4.0 - 10.5 K/uL   RBC 4.50 4.22 - 5.81 MIL/uL   Hemoglobin 13.6 13.0 - 17.0 g/dL   HCT 81.139.0 91.439.0 - 78.252.0 %   MCV 86.7 80.0 - 100.0 fL   MCH 30.2 26.0 - 34.0 pg   MCHC 34.9 30.0 - 36.0 g/dL   RDW 95.613.0 21.311.5 - 08.615.5 %   Platelets 299 150 - 400 K/uL   nRBC 0.0 0.0 - 0.2 %    Comment: Performed at [facility], [address] [place], [place] [address]  C-reactive protein     Status: Abnormal   Collection Time: 08/18/20  5:48 AM  Result Value Ref Range   CRP 7.2 (H) <1.0 mg/dL    Comment: Performed at [facility], [address] [place], [place] [address]  D-dimer, quantitative (not at [facility])      Status: Abnormal   Collection Time: 08/18/20  5:48 AM  Result Value Ref Range   D-Dimer, Quant 1.07 (H) 0.00 - 0.50 ug/mL-FEU    Comment: (NOTE) At the manufacturer cut-off value of 0.5 g/mL FEU, this assay has a negative predictive value of 95-100%.This assay is intended for use in conjunction with a clinical pretest probability (PTP) assessment model to exclude pulmonary embolism (PE) and deep venous thrombosis (DVT) in outpatients suspected of PE or DVT. Results should be correlated with clinical presentation. Performed at [facility], [address] [place], [place] [address]   Magnesium     Status: Abnormal   Collection Time: 08/19/20  5:26 AM  Result Value Ref Range   Magnesium 2.6 (H) 1.7 - 2.4 mg/dL    Comment: Performed at [facility], [address] [place], [place] [address]  Comprehensive metabolic panel     Status: Abnormal   Collection Time: 08/19/20  5:26 AM  Result Value Ref Range   Sodium 139 135 - 145 mmol/L   Potassium 4.2 3.5 - 5.1 mmol/L   Chloride 106 98 - 111 mmol/L   CO2 21 (L) 22 - 32 mmol/L   Glucose, Bld 157 (H) 70 - 99 mg/dL    Comment: Glucose reference range applies only to samples taken after fasting for at least 8 hours.   BUN 25 (H) 6 - 20 mg/dL  Creatinine, Ser 0.79 0.61 - 1.24 mg/dL   Calcium 8.4 (L) 8.9 - 10.3 mg/dL   Total Protein 6.4 (L) 6.5 - 8.1 g/dL   Albumin 2.9 (L) 3.5 - 5.0 g/dL   AST 562 (H) 15 - 41 U/L   ALT 101 (H) 0 - 44 U/L   Alkaline Phosphatase 62 38 - 126 U/L   Total Bilirubin 0.7 0.3 - 1.2 mg/dL   GFR, Estimated >13 >08 mL/min    Comment: (NOTE) Calculated using the CKD-EPI Creatinine Equation (2021)    Anion gap 12 5 - 15    Comment: Performed at [facility], [address] [place], [place] [address]  CBC     Status: None   Collection Time: 08/19/20  5:26 AM  Result Value Ref Range   WBC 6.2 4.0 - 10.5 K/uL   RBC 4.65 4.22 - 5.81 MIL/uL   Hemoglobin 13.9 13.0 - 17.0 g/dL   HCT 69.6  29.5 - 28.4 %   MCV 86.5 80.0 - 100.0 fL   MCH 29.9 26.0 - 34.0 pg   MCHC 34.6 30.0 - 36.0 g/dL   RDW 13.2 44.0 - 10.2 %   Platelets 341 150 - 400 K/uL   nRBC 0.0 0.0 - 0.2 %    Comment: Performed at [facility], [address] [place], [place] [address]  C-reactive protein     Status: Abnormal   Collection Time: 08/19/20  5:26 AM  Result Value Ref Range   CRP 3.0 (H) <1.0 mg/dL    Comment: Performed at [facility], [address] [place], [place] [address]  D-dimer, quantitative (not at [facility])     Status: Abnormal   Collection Time: 08/19/20  5:26 AM  Result Value Ref Range   D-Dimer, Quant 0.58 (H) 0.00 - 0.50 ug/mL-FEU    Comment: (NOTE) At the manufacturer cut-off value of 0.5 g/mL FEU, this assay has a negative predictive value of 95-100%.This assay is intended for use in conjunction with a clinical pretest probability (PTP) assessment model to exclude pulmonary embolism (PE) and deep venous thrombosis (DVT) in outpatients suspected of PE or DVT. Results should be correlated with clinical presentation. Performed at [facility], [address], [place], [place] [address]   Comprehensive metabolic panel     Status: Abnormal   Collection Time: 08/20/20  4:59 AM  Result Value Ref Range   Sodium 138 135 - 145 mmol/L   Potassium 4.2 3.5 - 5.1 mmol/L   Chloride 106 98 - 111 mmol/L   CO2 21 (L) 22 - 32 mmol/L   Glucose, Bld 165 (H) 70 - 99 mg/dL    Comment: Glucose reference range applies only to samples taken after fasting for at least 8 hours.   BUN 24 (H) 6 - 20 mg/dL   Creatinine, Ser 9.56 0.61 - 1.24 mg/dL   Calcium 8.3 (L) 8.9 - 10.3 mg/dL   Total Protein 6.3 (L) 6.5 - 8.1 g/dL   Albumin 2.8 (L) 3.5 - 5.0 g/dL   AST 387 (H) 15 - 41 U/L   ALT 160 (H) 0 - 44 U/L   Alkaline Phosphatase 63 38 - 126 U/L   Total Bilirubin 0.9 0.3 - 1.2 mg/dL   GFR, Estimated >56 >43 mL/min    Comment: (NOTE) Calculated using the CKD-EPI Creatinine Equation  (2021)    Anion gap 11 5 - 15    Comment: Performed at [facility], [address] [place],  [place] [address]  CBC     Status: None   Collection Time: 08/20/20  4:59 AM  Result Value Ref Range   WBC 6.8 4.0 - 10.5 K/uL   RBC 4.62 4.22 - 5.81 MIL/uL   Hemoglobin 13.9 13.0 - 17.0 g/dL   HCT 19.1 47.8 - 29.5 %   MCV 87.4 80.0 - 100.0 fL   MCH 30.1 26.0 - 34.0 pg   MCHC 34.4 30.0 - 36.0 g/dL   RDW 62.1 30.8 - 65.7 %   Platelets 382 150 - 400 K/uL   nRBC 0.0 0.0 - 0.2 %    Comment: Performed at [facility], [address] [place], [place] [address]  C-reactive protein     Status: Abnormal   Collection Time: 08/20/20  4:59 AM  Result Value Ref Range   CRP 1.4 (H) <1.0 mg/dL    Comment: Performed at [facility], [address], [place], [place] [address]  D-dimer, quantitative (not at [facility])     Status: Abnormal   Collection Time: 08/20/20  4:59 AM  Result Value Ref Range   D-Dimer, Quant 0.98 (H) 0.00 - 0.50 ug/mL-FEU    Comment: (NOTE) At the manufacturer cut-off value of 0.5 g/mL FEU, this assay has a negative predictive value of 95-100%.This assay is intended for use in conjunction with a clinical pretest probability (PTP) assessment model to exclude pulmonary embolism (PE) and deep venous thrombosis (DVT) in outpatients suspected of PE or DVT. Results should be correlated with clinical presentation. Performed at [facility], [address] [place], [place] [address]   Comprehensive metabolic panel     Status: Abnormal   Collection Time: 08/21/20  4:02 AM  Result Value Ref Range   Sodium 137 135 - 145 mmol/L   Potassium 4.5 3.5 - 5.1 mmol/L   Chloride 104 98 - 111 mmol/L   CO2 21 (L) 22 - 32 mmol/L   Glucose, Bld 148 (H) 70 - 99 mg/dL    Comment: Glucose reference range applies only to samples taken after fasting for at least 8 hours.   BUN 22 (H) 6 - 20 mg/dL   Creatinine, Ser 4.01 0.61 - 1.24 mg/dL   Calcium 8.3 (L) 8.9 - 10.3  mg/dL   Total Protein 6.2 (L) 6.5 - 8.1 g/dL   Albumin 2.8 (L) 3.5 - 5.0 g/dL   AST 027 (H) 15 - 41 U/L   ALT 213 (H) 0 - 44 U/L   Alkaline Phosphatase 65 38 - 126 U/L   Total Bilirubin 1.0 0.3 - 1.2 mg/dL   GFR, Estimated >25 >36 mL/min    Comment: (NOTE) Calculated using the CKD-EPI Creatinine Equation (2021)    Anion gap 12 5 - 15    Comment: Performed at [facility], [address] [place], [place] [address]  CBC     Status: Abnormal   Collection Time: 08/21/20  4:02 AM  Result Value Ref Range   WBC 6.9 4.0 - 10.5 K/uL   RBC 4.58 4.22 - 5.81 MIL/uL   Hemoglobin 13.9 13.0 - 17.0 g/dL   HCT 47.4 25.9 - 56.3 %   MCV 86.7 80.0 - 100.0 fL   MCH 30.3 26.0 - 34.0 pg   MCHC 35.0 30.0 - 36.0 g/dL   RDW 87.5 64.3 - 32.9 %   Platelets 421 (H) 150 - 400 K/uL   nRBC 0.0 0.0 - 0.2 %    Comment: Performed at [facility], [address] [place], [place] [address]  Magnesium     Status: None   Collection Time: 08/21/20  4:02 AM  Result Value Ref Range   Magnesium 2.4 1.7 - 2.4 mg/dL    Comment: Performed at [facility], [address] [place], [place] [address]  C-reactive protein     Status: Abnormal   Collection Time: 08/21/20  4:02 AM  Result Value Ref Range   CRP 1.1 (H) <1.0 mg/dL    Comment: Performed at [facility], [address], [place], [place] [address]  D-dimer, quantitative (not at [facility])     Status: Abnormal   Collection Time: 08/21/20  4:02 AM  Result Value Ref Range   D-Dimer, Quant 1.05 (H) 0.00 - 0.50 ug/mL-FEU    Comment: (NOTE) At the manufacturer cut-off value of 0.5 g/mL FEU, this assay has a negative predictive value of 95-100%.This assay is intended for use in conjunction with a clinical pretest probability (PTP) assessment model to exclude pulmonary embolism (PE) and deep venous thrombosis (DVT) in outpatients suspected of PE or DVT. Results should be correlated with clinical presentation. Performed at [facility], [address], [place], [place] [address]   D-dimer, quantitative (not at [facility])     Status: Abnormal   Collection Time: 08/22/20  7:00 AM  Result Value Ref Range   D-Dimer, Quant 0.89 (H) 0.00 - 0.50 ug/mL-FEU    Comment: (NOTE) At the manufacturer cut-off value of 0.5 g/mL FEU, this assay has a negative predictive value of 95-100%.This assay is intended for use in conjunction with a clinical pretest probability (PTP) assessment model to exclude pulmonary embolism (PE) and deep venous thrombosis (DVT) in outpatients suspected of PE or DVT. Results should be correlated with clinical presentation. Performed at [facility], [address] [place], [place] [address]   Comprehensive metabolic panel     Status: Abnormal   Collection Time: 08/22/20  7:04 AM  Result Value Ref Range   Sodium 138 135 - 145 mmol/L   Potassium 4.9 3.5 - 5.1 mmol/L   Chloride 104 98 - 111 mmol/L   CO2 22 22 - 32 mmol/L   Glucose, Bld 161 (H) 70 - 99 mg/dL    Comment: Glucose reference range applies only to samples taken after fasting for at least 8 hours.   BUN 29 (H) 6 - 20 mg/dL   Creatinine, Ser 1.30 0.61 - 1.24 mg/dL   Calcium 8.7 (L) 8.9 - 10.3 mg/dL   Total Protein 6.9 6.5 - 8.1 g/dL   Albumin 3.0 (L) 3.5 - 5.0 g/dL   AST 48 (H) 15 - 41 U/L   ALT 158 (H) 0 - 44 U/L   Alkaline Phosphatase 70 38 - 126 U/L   Total Bilirubin 1.0 0.3 - 1.2 mg/dL   GFR, Estimated >86 >57 mL/min    Comment: (NOTE) Calculated using the CKD-EPI Creatinine Equation (2021)    Anion gap 12 5 - 15    Comment: Performed at [facility], [address] [place], [place] [address]  C-reactive protein     Status: Abnormal   Collection Time: 08/22/20  7:04 AM  Result Value Ref Range   CRP 1.5 (H) <1.0 mg/dL    Comment: Performed at [facility], [address] [place], [place] [address]  CBC     Status: Abnormal   Collection Time: 08/22/20  7:04 AM  Result Value Ref Range   WBC 7.0  4.0 -  10.5 K/uL   RBC 4.83 4.22 - 5.81 MIL/uL   Hemoglobin 14.3 13.0 - 17.0 g/dL   HCT 61.9 50.9 - 32.6 %   MCV 87.6 80.0 - 100.0 fL   MCH 29.6 26.0 - 34.0 pg   MCHC 33.8 30.0 - 36.0 g/dL   RDW 71.2 45.8 - 09.9 %   Platelets 519 (H) 150 - 400 K/uL   nRBC 0.0 0.0 - 0.2 %    Comment: Performed at [facility], [address] [place], [place] [address]  Comprehensive metabolic panel     Status: Abnormal   Collection Time: 08/23/20  5:32 AM  Result Value Ref Range   Sodium 139 135 - 145 mmol/L   Potassium 4.9 3.5 - 5.1 mmol/L   Chloride 105 98 - 111 mmol/L   CO2 23 22 - 32 mmol/L   Glucose, Bld 157 (H) 70 - 99 mg/dL    Comment: Glucose reference range applies only to samples taken after fasting for at least 8 hours.   BUN 31 (H) 6 - 20 mg/dL   Creatinine, Ser 5.05 0.61 - 1.24 mg/dL   Calcium 8.5 (L) 8.9 - 10.3 mg/dL   Total Protein 6.6 6.5 - 8.1 g/dL   Albumin 3.0 (L) 3.5 - 5.0 g/dL   AST 47 (H) 15 - 41 U/L   ALT 146 (H) 0 - 44 U/L   Alkaline Phosphatase 76 38 - 126 U/L   Total Bilirubin 1.0 0.3 - 1.2 mg/dL   GFR, Estimated >39 >76 mL/min    Comment: (NOTE) Calculated using the CKD-EPI Creatinine Equation (2021)    Anion gap 11 5 - 15    Comment: Performed at [facility], [address] [place], [place] [address]  CBC     Status: Abnormal   Collection Time: 08/23/20  5:32 AM  Result Value Ref Range   WBC 8.5 4.0 - 10.5 K/uL   RBC 4.87 4.22 - 5.81 MIL/uL   Hemoglobin 14.7 13.0 - 17.0 g/dL   HCT 37.9 02.4 - 09.7 %   MCV 86.2 80.0 - 100.0 fL   MCH 30.2 26.0 - 34.0 pg   MCHC 35.0 30.0 - 36.0 g/dL   RDW 35.3 29.9 - 24.2 %   Platelets 534 (H) 150 - 400 K/uL   nRBC 0.0 0.0 - 0.2 %    Comment: Performed at [facility], [address] [place], [place] [address]  Fibrin derivatives D-Dimer [facility] only)     Status: Abnormal   Collection Time: 08/23/20  5:32 AM  Result Value Ref Range   Fibrin derivatives D-dimer ([facility]) 740.34 (H) 0.00 - 499.00 ng/mL  (FEU)    Comment: (NOTE) <> Exclusion of Venous Thromboembolism (VTE) - OUTPATIENT ONLY   (Emergency Department or [place])    0-499 ng/ml (FEU): With a low to intermediate pretest probability                      for VTE this test result excludes the diagnosis                      of VTE.   >499 ng/ml (FEU) : VTE not excluded; additional work up for VTE is                      required.  <> Testing on Inpatients and Evaluation of Disseminated Intravascular   Coagulation (DIC) Reference Range:  0-499 ng/ml (FEU) Performed at [facility], [address] [place], [place] [address]   C-reactive protein     Status: None   Collection Time: 08/23/20  5:32 AM  Result Value Ref Range   CRP 0.7 <1.0 mg/dL    Comment: Performed at [facility], [address], [place], [place] [address]  Comprehensive metabolic panel     Status: Abnormal   Collection Time: 08/24/20  4:30 AM  Result Value Ref Range   Sodium 134 (L) 135 - 145 mmol/L   Potassium 4.8 3.5 - 5.1 mmol/L   Chloride 103 98 - 111 mmol/L   CO2 23 22 - 32 mmol/L   Glucose, Bld 153 (H) 70 - 99 mg/dL    Comment: Glucose reference range applies only to samples taken after fasting for at least 8 hours.   BUN 24 (H) 6 - 20 mg/dL   Creatinine, Ser 9.62 0.61 - 1.24 mg/dL   Calcium 8.4 (L) 8.9 - 10.3 mg/dL   Total Protein 6.5 6.5 - 8.1 g/dL   Albumin 2.9 (L) 3.5 - 5.0 g/dL   AST 41 15 - 41 U/L   ALT 135 (H) 0 - 44 U/L   Alkaline Phosphatase 74 38 - 126 U/L   Total Bilirubin 0.9 0.3 - 1.2 mg/dL   GFR, Estimated >95 >28 mL/min    Comment: (NOTE) Calculated using the CKD-EPI Creatinine Equation (2021)    Anion gap 8 5 - 15    Comment: Performed at [facility], [address] [place], [place] [address]  CBC     Status: Abnormal   Collection Time: 08/24/20  4:30 AM  Result Value Ref Range   WBC 9.6 4.0 - 10.5 K/uL   RBC 4.87 4.22 - 5.81 MIL/uL   Hemoglobin 14.2 13.0 - 17.0 g/dL   HCT 40.1 02.7 - 25.3 %    MCV 87.7 80.0 - 100.0 fL   MCH 29.2 26.0 - 34.0 pg   MCHC 33.3 30.0 - 36.0 g/dL   RDW 66.4 40.3 - 47.4 %   Platelets 524 (H) 150 - 400 K/uL   nRBC 0.0 0.0 - 0.2 %    Comment: Performed at [facility], [address] [place], [place] [address]  C-reactive protein     Status: None   Collection Time: 08/24/20  4:30 AM  Result Value Ref Range   CRP 0.6 <1.0 mg/dL    Comment: Performed at [facility], [address] [place], [place] [address]  Fibrin derivatives D-Dimer [facility] only)     Status: Abnormal   Collection Time: 08/24/20  4:30 AM  Result Value Ref Range   Fibrin derivatives D-dimer ([facility]) 595.68 (H) 0.00 - 499.00 ng/mL (FEU)    Comment: (NOTE) <> Exclusion of Venous Thromboembolism (VTE) - OUTPATIENT ONLY   (Emergency Department or [place])    0-499 ng/ml (FEU): With a low to intermediate pretest probability                      for VTE this test result excludes the diagnosis                      of VTE.   >499 ng/ml (FEU) : VTE not excluded; additional work up for VTE is                      required.  <> Testing on Inpatients and  Evaluation of Disseminated Intravascular   Coagulation (DIC) Reference Range:   0-499 ng/ml (FEU) Performed at [facility], [address] [place], [place] [address]   Comprehensive metabolic panel     Status: Abnormal   Collection Time: 08/25/20  6:49 AM  Result Value Ref Range   Sodium 135 135 - 145 mmol/L   Potassium 4.8 3.5 - 5.1 mmol/L   Chloride 102 98 - 111 mmol/L   CO2 24 22 - 32 mmol/L   Glucose, Bld 142 (H) 70 - 99 mg/dL    Comment: Glucose reference range applies only to samples taken after fasting for at least 8 hours.   BUN 22 (H) 6 - 20 mg/dL   Creatinine, Ser 6.72 0.61 - 1.24 mg/dL   Calcium 8.4 (L) 8.9 - 10.3 mg/dL   Total Protein 6.1 (L) 6.5 - 8.1 g/dL   Albumin 2.8 (L) 3.5 - 5.0 g/dL   AST 34 15 - 41 U/L   ALT 118 (H) 0 - 44 U/L   Alkaline Phosphatase 70 38 - 126 U/L   Total Bilirubin  0.7 0.3 - 1.2 mg/dL   GFR, Estimated >09 >47 mL/min    Comment: (NOTE) Calculated using the CKD-EPI Creatinine Equation (2021)    Anion gap 9 5 - 15    Comment: Performed at [facility], [address] [place], [place] [address]  Brain natriuretic peptide     Status: None   Collection Time: 08/25/20  6:49 AM  Result Value Ref Range   B Natriuretic Peptide 43.2 0.0 - 100.0 pg/mL    Comment: Performed at [facility], [address] [place], [place] [address]  Osmolality     Status: None   Collection Time: 08/26/20  6:13 AM  Result Value Ref Range   Osmolality 291 275 - 295 mOsm/kg    Comment: Performed at [facility], [address] [place], [place] [address]  Comprehensive metabolic panel     Status: Abnormal   Collection Time: 08/26/20  6:14 AM  Result Value Ref Range   Sodium 129 (L) 135 - 145 mmol/L   Potassium 5.1 3.5 - 5.1 mmol/L   Chloride 95 (L) 98 - 111 mmol/L   CO2 27 22 - 32 mmol/L   Glucose, Bld 126 (H) 70 - 99 mg/dL    Comment: Glucose reference range applies only to samples taken after fasting for at least 8 hours.   BUN 22 (H) 6 - 20 mg/dL   Creatinine, Ser 6.50 0.61 - 1.24 mg/dL   Calcium 8.4 (L) 8.9 - 10.3 mg/dL   Total Protein 6.1 (L) 6.5 - 8.1 g/dL   Albumin 2.8 (L) 3.5 - 5.0 g/dL   AST 34 15 - 41 U/L   ALT 136 (H) 0 - 44 U/L   Alkaline Phosphatase 68 38 - 126 U/L   Total Bilirubin 0.7 0.3 - 1.2 mg/dL   GFR, Estimated >35 >46 mL/min    Comment: (NOTE) Calculated using the CKD-EPI Creatinine Equation (2021)    Anion gap 7 5 - 15    Comment: Performed at [facility], [address] [place], [place] [address]  Comprehensive metabolic panel     Status: Abnormal   Collection Time: 08/27/20  5:58 AM  Result Value Ref Range   Sodium 134 (L) 135 - 145 mmol/L   Potassium 4.6 3.5 - 5.1 mmol/L   Chloride 100 98 - 111 mmol/L   CO2 25 22 - 32 mmol/L  Glucose, Bld 122 (H) 70 - 99 mg/dL    Comment: Glucose reference  range applies only to samples taken after fasting for at least 8 hours.   BUN 19 6 - 20 mg/dL   Creatinine, Ser 8.41 0.61 - 1.24 mg/dL   Calcium 8.5 (L) 8.9 - 10.3 mg/dL   Total Protein 5.8 (L) 6.5 - 8.1 g/dL   Albumin 2.7 (L) 3.5 - 5.0 g/dL   AST 52 (H) 15 - 41 U/L   ALT 170 (H) 0 - 44 U/L   Alkaline Phosphatase 64 38 - 126 U/L   Total Bilirubin 0.6 0.3 - 1.2 mg/dL   GFR, Estimated >32 >44 mL/min    Comment: (NOTE) Calculated using the CKD-EPI Creatinine Equation (2021)    Anion gap 9 5 - 15    Comment: Performed at [facility], [address] [place], [place] [address]  Comprehensive metabolic panel     Status: Abnormal   Collection Time: 08/28/20  7:29 AM  Result Value Ref Range   Sodium 137 135 - 145 mmol/L   Potassium 4.2 3.5 - 5.1 mmol/L   Chloride 102 98 - 111 mmol/L   CO2 27 22 - 32 mmol/L   Glucose, Bld 80 70 - 99 mg/dL    Comment: Glucose reference range applies only to samples taken after fasting for at least 8 hours.   BUN 24 (H) 6 - 20 mg/dL   Creatinine, Ser 2.53 0.61 - 1.24 mg/dL   Calcium 8.4 (L) 8.9 - 10.3 mg/dL   Total Protein 5.6 (L) 6.5 - 8.1 g/dL   Albumin 2.6 (L) 3.5 - 5.0 g/dL   AST 52 (H) 15 - 41 U/L   ALT 204 (H) 0 - 44 U/L   Alkaline Phosphatase 59 38 - 126 U/L   Total Bilirubin 0.7 0.3 - 1.2 mg/dL   GFR, Estimated >66 >44 mL/min    Comment: (NOTE) Calculated using the CKD-EPI Creatinine Equation (2021)    Anion gap 8 5 - 15    Comment: Performed at [facility], [address] [place], [place] [address]  C-reactive protein     Status: None   Collection Time: 08/28/20  7:29 AM  Result Value Ref Range   CRP 0.5 <1.0 mg/dL    Comment: Performed at [facility], [address] [place], [place] [address]    -------------------------------------------------------------------------- A&P:  Problem List Items Addressed This Visit   None   Visit Diagnoses    Acute pharyngitis, unspecified etiology    -  Primary    Relevant Medications   magic mouthwash w/lidocaine SOLN   amoxicillin (AMOXIL) 500 MG capsule      Consistent with acute pharyngitis, concern for possible strep given history acute pharyngitis without viral prodrome, lack of cough, exudates Recent COVID Unvaccinated  Plan:  Start empiric antibiotic with Amoxicillin 500mg  BID x 10 days, will adjust based on improvement and culture results Magic mouthwash rx fax in, cover in case similar to prior thrush he has had Symptomatic control with NSAID / Tylenol regularly then PRN Improve hydration, advance diet, warm herbal tea with honey Follow-up within 1 week if worsening   Meds ordered this encounter  Medications  . magic mouthwash w/lidocaine SOLN    Sig: Swish, gargle, and spit one to two teaspoonfuls every six hours as needed. Shake well before using.    Dispense:  120 mL    Refill:  0    1 Part viscous lidocaine 2%  1 Part Maalox  1 Part diphenhydramine 12.5 mg per 5 ml elixir 1 Part Nystatin  . amoxicillin (AMOXIL) 500 MG capsule    Sig: Take 1 capsule (500 mg total) by mouth 2 (two) times daily. For 10 days    Dispense:  20 capsule    Refill:  0    Follow-up: - Return in 1-2 weeks if unresolved  Patient verbalizes understanding with the above medical recommendations including the limitation of remote medical advice.  Specific follow-up and call-back criteria were given for patient to follow-up or seek medical care more urgently if needed.   - Time spent in direct consultation with patient on phone: 9 minutes   Saralyn Pilar, DO [facility] 09/01/2020, 9:48 AM

## 2020-09-01 NOTE — Addendum Note (Signed)
Addended by: Lonna Cobb on: 09/01/2020 10:55 AM   Modules accepted: Orders

## 2020-09-07 ENCOUNTER — Encounter: Payer: Self-pay | Admitting: Family Medicine

## 2020-09-08 ENCOUNTER — Telehealth: Payer: BC Managed Care – PPO | Admitting: Family Medicine

## 2020-11-05 ENCOUNTER — Other Ambulatory Visit: Payer: Self-pay | Admitting: Family Medicine

## 2020-11-05 DIAGNOSIS — J320 Chronic maxillary sinusitis: Secondary | ICD-10-CM

## 2020-11-05 NOTE — Telephone Encounter (Signed)
Approved per protocol. Future visit 11/24/20.  Requested Prescriptions  Pending Prescriptions Disp Refills  . montelukast (SINGULAIR) 10 MG tablet [Pharmacy Med Name: MONTELUKAST 10MG  TABLETS] 90 tablet 0    Sig: TAKE 1 TABLET(10 MG) BY MOUTH AT BEDTIME     Pulmonology:  Leukotriene Inhibitors Passed - 11/05/2020  3:23 AM      Passed - Valid encounter within last 12 months    Recent Outpatient Visits          2 months ago Acute pharyngitis, unspecified etiology   [facility] [place] [place], [place]   5 months ago Mixed hyperlipidemia   [facility], DO   11 months ago Annual physical exam   [facility], DO   1 year ago Chronic maxillary sinusitis   [facility], DO   1 year ago Acute bilateral low back pain without sciatica   [facility], DO      Future Appointments            In 2 weeks Smitty Cords, Althea Charon, DO [facility]

## 2020-11-20 ENCOUNTER — Other Ambulatory Visit: Payer: Self-pay

## 2020-11-20 ENCOUNTER — Other Ambulatory Visit: Payer: BC Managed Care – PPO

## 2020-11-20 DIAGNOSIS — E782 Mixed hyperlipidemia: Secondary | ICD-10-CM

## 2020-11-20 DIAGNOSIS — Z125 Encounter for screening for malignant neoplasm of prostate: Secondary | ICD-10-CM

## 2020-11-20 DIAGNOSIS — I10 Essential (primary) hypertension: Secondary | ICD-10-CM

## 2020-11-20 DIAGNOSIS — E669 Obesity, unspecified: Secondary | ICD-10-CM

## 2020-11-20 DIAGNOSIS — R7309 Other abnormal glucose: Secondary | ICD-10-CM

## 2020-11-20 DIAGNOSIS — Z0000 Encounter for general adult medical examination without abnormal findings: Secondary | ICD-10-CM

## 2020-11-21 LAB — LIPID PANEL
Cholesterol: 153 mg/dL (ref <200)
HDL: 45 mg/dL (ref >=40)
LDL Cholesterol (Calc): 82 mg/dL (calc)
Non-HDL Cholesterol (Calc): 108 mg/dL (calc) (ref <130)
Total CHOL/HDL Ratio: 3.4 (calc) (ref <5.0)
Triglycerides: 161 mg/dL — ABNORMAL HIGH (ref <150)

## 2020-11-21 LAB — CBC WITH DIFFERENTIAL/PLATELET
Absolute Monocytes: 659 cells/uL (ref 200–950)
Basophils Absolute: 77 cells/uL (ref 0–200)
Basophils Relative: 1.2 %
Eosinophils Absolute: 301 cells/uL (ref 15–500)
Eosinophils Relative: 4.7 %
HCT: 45 % (ref 38.5–50.0)
Hemoglobin: 14.8 g/dL (ref 13.2–17.1)
Lymphs Abs: 2406 cells/uL (ref 850–3900)
MCH: 28.9 pg (ref 27.0–33.0)
MCHC: 32.9 g/dL (ref 32.0–36.0)
MCV: 87.9 fL (ref 80.0–100.0)
MPV: 9.5 fL (ref 7.5–12.5)
Monocytes Relative: 10.3 %
Neutro Abs: 2957 cells/uL (ref 1500–7800)
Neutrophils Relative %: 46.2 %
Platelets: 310 10*3/uL (ref 140–400)
RBC: 5.12 10*6/uL (ref 4.20–5.80)
RDW: 12.8 % (ref 11.0–15.0)
Total Lymphocyte: 37.6 %
WBC: 6.4 10*3/uL (ref 3.8–10.8)

## 2020-11-21 LAB — COMPLETE METABOLIC PANEL WITH GFR
AG Ratio: 1.9 (calc) (ref 1.0–2.5)
ALT: 38 U/L (ref 9–46)
AST: 28 U/L (ref 10–35)
Albumin: 4.4 g/dL (ref 3.6–5.1)
Alkaline phosphatase (APISO): 120 U/L (ref 35–144)
BUN: 16 mg/dL (ref 7–25)
CO2: 22 mmol/L (ref 20–32)
Calcium: 9 mg/dL (ref 8.6–10.3)
Chloride: 107 mmol/L (ref 98–110)
Creat: 0.92 mg/dL (ref 0.70–1.33)
GFR, Est African American: 108 mL/min/{1.73_m2} (ref >=60)
GFR, Est Non African American: 93 mL/min/{1.73_m2} (ref >=60)
Globulin: 2.3 g/dL (calc) (ref 1.9–3.7)
Glucose, Bld: 111 mg/dL — ABNORMAL HIGH (ref 65–99)
Potassium: 3.8 mmol/L (ref 3.5–5.3)
Sodium: 140 mmol/L (ref 135–146)
Total Bilirubin: 0.6 mg/dL (ref 0.2–1.2)
Total Protein: 6.7 g/dL (ref 6.1–8.1)

## 2020-11-21 LAB — HEMOGLOBIN A1C
Hgb A1c MFr Bld: 5.1 % of total Hgb (ref <5.7)
Mean Plasma Glucose: 100 mg/dL
eAG (mmol/L): 5.5 mmol/L

## 2020-11-21 LAB — TSH: TSH: 1.33 mIU/L (ref 0.40–4.50)

## 2020-11-21 LAB — PSA: PSA: 0.29 ng/mL (ref <=4.0)

## 2020-11-24 ENCOUNTER — Ambulatory Visit (INDEPENDENT_AMBULATORY_CARE_PROVIDER_SITE_OTHER): Payer: BC Managed Care – PPO | Admitting: Family Medicine

## 2020-11-24 ENCOUNTER — Other Ambulatory Visit: Payer: Self-pay

## 2020-11-24 ENCOUNTER — Encounter: Payer: Self-pay | Admitting: Family Medicine

## 2020-11-24 VITALS — BP 129/91 | HR 85 | Ht 72.0 in | Wt 221.6 lb

## 2020-11-24 DIAGNOSIS — E669 Obesity, unspecified: Secondary | ICD-10-CM | POA: Diagnosis not present

## 2020-11-24 DIAGNOSIS — I10 Essential (primary) hypertension: Secondary | ICD-10-CM

## 2020-11-24 DIAGNOSIS — F4322 Adjustment disorder with anxiety: Secondary | ICD-10-CM

## 2020-11-24 DIAGNOSIS — Z0000 Encounter for general adult medical examination without abnormal findings: Secondary | ICD-10-CM

## 2020-11-24 DIAGNOSIS — E782 Mixed hyperlipidemia: Secondary | ICD-10-CM

## 2020-11-24 DIAGNOSIS — F5104 Psychophysiologic insomnia: Secondary | ICD-10-CM

## 2020-11-24 DIAGNOSIS — J3089 Other allergic rhinitis: Secondary | ICD-10-CM

## 2020-11-24 MED ORDER — HYDROXYZINE HCL 50 MG PO TABS: 50.0000 mg | ORAL_TABLET | Freq: Three times a day (TID) | ORAL | 2 refills | Status: DC | PRN

## 2020-11-24 MED ORDER — ROSUVASTATIN CALCIUM 10 MG PO TABS: 10.0000 mg | ORAL_TABLET | Freq: Every day | ORAL | 3 refills | Status: DC

## 2020-11-24 MED ORDER — AMLODIPINE BESYLATE 10 MG PO TABS: 10.0000 mg | ORAL_TABLET | Freq: Every day | ORAL | 3 refills | Status: DC

## 2020-11-24 MED ORDER — MONTELUKAST SODIUM 10 MG PO TABS: 10.0000 mg | ORAL_TABLET | Freq: Every day | ORAL | 3 refills | Status: DC

## 2020-11-24 NOTE — Progress Notes (Signed)
Subjective:    Patient ID: Timothy Lang, male    DOB: [birth year], 55 y.o.   MRN: [id number]  Timothy Lang is a 55 y.o. male presenting on 11/24/2020 for Annual Exam and Hyperlipidemia   HPI  Here for Annual Physical and Lab Review.  CHRONIC HTN: He is doing well. Home BP readings reviewed Current Meds -Amlodipine 10mg  daily Lifestyle: - Diet:episodic, some higher salt content can raise it. Tries to avoid salt - Exercise:episodic, seasonal will be more active at times Admits very rare dizziness episode - prior BPPV improved. He could not work while on meclizine Denies CP, dyspnea, HA, edema, dizziness  HYPERLIPIDEMIA: - Reports no concerns. Last lipid panel 11/2020, controlled  - Currently taking Rosuvastatin 10mg , tolerating well without side effects or myalgias  Allergies/Seasonal On Singulair 10mg  nightly Flonase  Complicated course in the past 3+ months, [place] in [place], he believe he got COVID from the hospital, and his father passed. After getting COVID he was in the ICU very ill. His dog passed away recently as well. He has had other life stressors impacting him. - Now admits very poor sleep, insomnia, avg 20 hours sleep per week - He is very anxious - Also allergic to cats, had to take his father's pet cats. He ha allergic rhinitis and eye itching. - He has history of trial on Hydroxyzine for similar anxiety insomnia in the past.  Health Maintenance:  Has not had COVID vaccine, he had COVID. Hesitant.  PSA Prostate Cancer Screening - 0.29, negative  [facility] 11/24/2020 05/23/2020 11/15/2019  Decreased Interest 1 0 0  Down, Depressed, Hopeless 1 0 0  PHQ - 2 Score 2 0 0  Altered sleeping 3 - -  Tired, decreased energy 2 - -  Change in appetite 1 - -  Feeling bad or failure about yourself  1 - -  Trouble concentrating 1 - -  Moving slowly or fidgety/restless 1 - -  Suicidal thoughts 0 - -  PHQ-9 Score 11 - -   Difficult doing work/chores Somewhat difficult - -   GAD 7 : Generalized Anxiety Score 11/24/2020  Nervous, Anxious, on Edge 3  Control/stop worrying 3  Worry too much - different things 3  Trouble relaxing 2  Restless 3  Easily annoyed or irritable 3  Afraid - awful might happen 3  Total GAD 7 Score 20  Anxiety Difficulty Very difficult     Past Medical History:  Diagnosis Date  . Allergic rhinitis   . HLD (hyperlipidemia)   . HTN (hypertension)    Past Surgical History:  Procedure Laterality Date  . KNEE ARTHROSCOPY WITH MEDIAL MENISECTOMY Left 04/16/2017   Procedure: KNEE ARTHROSCOPY WITH PARTIAL MEDIAL MENISECTOMY, CHONDROPLASTY, PARTIAL SYNOVECTOMY;  Surgeon: Lyndle HerrlichBowers, James R, MD;  Location: [facility] ORS;  Service: Orthopedics;  Laterality: Left;  . SURGERY OF LIP  2008  . TONSILLECTOMY  1974  . TOOTH EXTRACTION  2008  . vasectomy  1997   Social History   Socioeconomic History  . Marital status: Single    Spouse name: Not on file  . Number of children: Not on file  . Years of education: Not on file  . Highest education level: Not on file  Occupational History  . Not on file  Tobacco Use  . Smoking status: Former Smoker    Packs/day: 0.50    Types: Cigarettes    Quit date: 04/15/2016    Years since  quitting: 4.6  . Smokeless tobacco: Former Clinical biochemist  . Vaping Use: Never used  Substance and Sexual Activity  . Alcohol use: No  . Drug use: No  . Sexual activity: Not on file  Other Topics Concern  . Not on file  Social History Narrative  . Not on file   Social Determinants of Health   Financial Resource Strain: Not on file  Food Insecurity: Not on file  Transportation Needs: Not on file  Physical Activity: Not on file  Stress: Not on file  Social Connections: Not on file  Intimate Partner Violence: Not on file   Family History  Problem Relation Age of Onset  . Breast cancer Mother    Current Outpatient Medications on File Prior to Visit   Medication Sig  . albuterol (VENTOLIN HFA) 108 (90 Base) MCG/ACT inhaler Inhale 1-2 puffs into the lungs every 4 (four) hours as needed for wheezing or shortness of breath.  . fluticasone (FLONASE) 50 MCG/ACT nasal spray Place 2 sprays into both nostrils daily.  . sildenafil (REVATIO) 20 MG tablet Take 1-5 pills about 30 min prior to sex. Start with 1 and increase as needed. (Patient not taking: No sig reported)   No current facility-administered medications on file prior to visit.    Review of Systems  Constitutional: Negative for activity change, appetite change, chills, diaphoresis, fatigue and fever.  HENT: Negative for congestion and hearing loss.   Eyes: Negative for visual disturbance.  Respiratory: Negative for cough, chest tightness, shortness of breath and wheezing.   Cardiovascular: Negative for chest pain, palpitations and leg swelling.  Gastrointestinal: Negative for abdominal pain, constipation, diarrhea, nausea and vomiting.  Genitourinary: Negative for dysuria, frequency and hematuria.  Musculoskeletal: Negative for arthralgias and neck pain.  Skin: Negative for rash.  Allergic/Immunologic: Negative for environmental allergies.  Neurological: Negative for dizziness, weakness, light-headedness, numbness and headaches.  Hematological: Negative for adenopathy.  Psychiatric/Behavioral: Positive for dysphoric mood and sleep disturbance. Negative for behavioral problems. The patient is nervous/anxious.    Per HPI unless specifically indicated above      Objective:    BP (!) 129/91   Pulse 85   Ht 6' (1.829 m)   Wt 221 lb 9.6 oz (100.5 kg)   SpO2 99%   BMI 30.05 kg/m   Wt Readings from Last 3 Encounters:  11/24/20 221 lb 9.6 oz (100.5 kg)  08/25/20 205 lb 0.4 oz (93 kg)  08/13/20 225 lb (102.1 kg)    Physical Exam Vitals and nursing note reviewed.  Constitutional:      General: He is not in acute distress.    Appearance: He is well-developed. He is not  diaphoretic.     Comments: Well-appearing, comfortable, cooperative  HENT:     Head: Normocephalic and atraumatic.  Eyes:     General:        Right eye: No discharge.        Left eye: No discharge.     Conjunctiva/sclera: Conjunctivae normal.     Pupils: Pupils are equal, round, and reactive to light.  Neck:     Thyroid: No thyromegaly.  Cardiovascular:     Rate and Rhythm: Normal rate and regular rhythm.     Heart sounds: Normal heart sounds. No murmur heard.   Pulmonary:     Effort: Pulmonary effort is normal. No respiratory distress.     Breath sounds: Normal breath sounds. No wheezing or rales.  Abdominal:     General: Bowel  sounds are normal. There is no distension.     Palpations: Abdomen is soft. There is no mass.     Tenderness: There is no abdominal tenderness.  Musculoskeletal:        General: No tenderness. Normal range of motion.     Cervical back: Normal range of motion and neck supple.     Comments: Upper / Lower Extremities: - Normal muscle tone, strength bilateral upper extremities 5/5, lower extremities 5/5  Lymphadenopathy:     Cervical: No cervical adenopathy.  Skin:    General: Skin is warm and dry.     Findings: No erythema or rash.  Neurological:     Mental Status: He is alert and oriented to person, place, and time.     Comments: Distal sensation intact to light touch all extremities  Psychiatric:        Behavior: Behavior normal.     Comments: Well groomed, good eye contact, normal speech and thoughts    Results for orders placed or performed in visit on 11/20/20  TSH  Result Value Ref Range   TSH 1.33 0.40 - 4.50 mIU/L  PSA  Result Value Ref Range   PSA 0.29 < OR = 4.0 ng/mL  Lipid panel  Result Value Ref Range   Cholesterol 153 <200 mg/dL   HDL 45 > OR = 40 mg/dL   Triglycerides 413 (H) <150 mg/dL   LDL Cholesterol (Calc) 82 mg/dL (calc)   Total CHOL/HDL Ratio 3.4 <5.0 (calc)   Non-HDL Cholesterol (Calc) 108 <130 mg/dL (calc)   COMPLETE METABOLIC PANEL WITH GFR  Result Value Ref Range   Glucose, Bld 111 (H) 65 - 99 mg/dL   BUN 16 7 - 25 mg/dL   Creat 2.44 0.10 - 2.72 mg/dL   GFR, Est Non African American 93 > OR = 60 mL/min/1.92m2   GFR, Est African American 108 > OR = 60 mL/min/1.71m2   BUN/Creatinine Ratio NOT APPLICABLE 6 - 22 (calc)   Sodium 140 135 - 146 mmol/L   Potassium 3.8 3.5 - 5.3 mmol/L   Chloride 107 98 - 110 mmol/L   CO2 22 20 - 32 mmol/L   Calcium 9.0 8.6 - 10.3 mg/dL   Total Protein 6.7 6.1 - 8.1 g/dL   Albumin 4.4 3.6 - 5.1 g/dL   Globulin 2.3 1.9 - 3.7 g/dL (calc)   AG Ratio 1.9 1.0 - 2.5 (calc)   Total Bilirubin 0.6 0.2 - 1.2 mg/dL   Alkaline phosphatase (APISO) 120 35 - 144 U/L   AST 28 10 - 35 U/L   ALT 38 9 - 46 U/L  CBC with Differential/Platelet  Result Value Ref Range   WBC 6.4 3.8 - 10.8 Thousand/uL   RBC 5.12 4.20 - 5.80 Million/uL   Hemoglobin 14.8 13.2 - 17.1 g/dL   HCT 53.6 64.4 - 03.4 %   MCV 87.9 80.0 - 100.0 fL   MCH 28.9 27.0 - 33.0 pg   MCHC 32.9 32.0 - 36.0 g/dL   RDW 74.2 59.5 - 63.8 %   Platelets 310 140 - 400 Thousand/uL   MPV 9.5 7.5 - 12.5 fL   Neutro Abs 2,957 1,500 - 7,800 cells/uL   Lymphs Abs 2,406 850 - 3,900 cells/uL   Absolute Monocytes 659 200 - 950 cells/uL   Eosinophils Absolute 301 15 - 500 cells/uL   Basophils Absolute 77 0 - 200 cells/uL   Neutrophils Relative % 46.2 %   Total Lymphocyte 37.6 %   Monocytes Relative 10.3 %  Eosinophils Relative 4.7 %   Basophils Relative 1.2 %  Hemoglobin A1c  Result Value Ref Range   Hgb A1c MFr Bld 5.1 <5.7 % of total Hgb   Mean Plasma Glucose 100 mg/dL   eAG (mmol/L) 5.5 mmol/L      Assessment & Plan:   Problem List Items Addressed This Visit    Psychophysiological insomnia    Clinically with secondary anxiety, adjustment disorder Trial on Hydroxyzine 50mg  PRN, previous success F/u 6 weeks on insomnia,anxiety consider other meds      Obesity (BMI 30.0-34.9)   Mixed hyperlipidemia     Controlled cholesterol on statin and lifestyle Last lipid panel 11/2020  Plan: 1. Continue current meds - Rosuvastatin 10mg  nightly 2. Encourage improved lifestyle - low carb/cholesterol, reduce portion size, continue improving regular exercise      Relevant Medications   rosuvastatin (CRESTOR) 10 MG tablet   amLODipine (NORVASC) 10 MG tablet   Essential hypertension    Mild elevated DBP today with acute anxiety Home results reviewed No known complications    Plan:  1. Continue Amlodipine 10mg  daily 2. Encourage improved lifestyle - low sodium diet, regular exercise 3. Continue monitor BP outside office, bring readings to next visit, if persistently >140/90 or new symptoms notify office sooner      Relevant Medications   rosuvastatin (CRESTOR) 10 MG tablet   amLODipine (NORVASC) 10 MG tablet   Adjustment disorder with anxiety   Relevant Medications   hydrOXYzine (ATARAX/VISTARIL) 50 MG tablet    Other Visit Diagnoses    Annual physical exam    -  Primary   Seasonal allergic rhinitis due to other allergic trigger       Relevant Medications   montelukast (SINGULAIR) 10 MG tablet      Updated Health Maintenance information Reviewed recent lab results with patient Encouraged improvement to lifestyle with diet and exercise Goal of weight loss  #Adjustment disorder / anxiety Consistent with acute adjustment with stressors in life right now major events Primary symptoms with anxiety and insomnia Trial on Hydroxyzine 50mg  PRN for symptom relief Consider longer acting medication SSRI etc but will defer for now, return in 4-6 weeks f/u on this sooner if needed.   Meds ordered this encounter  Medications  . hydrOXYzine (ATARAX/VISTARIL) 50 MG tablet    Sig: Take 1 tablet (50 mg total) by mouth 3 (three) times daily as needed for anxiety or itching.    Dispense:  90 tablet    Refill:  2  . rosuvastatin (CRESTOR) 10 MG tablet    Sig: Take 1 tablet (10 mg total) by mouth at  bedtime.    Dispense:  90 tablet    Refill:  3  . montelukast (SINGULAIR) 10 MG tablet    Sig: Take 1 tablet (10 mg total) by mouth at bedtime.    Dispense:  90 tablet    Refill:  3  . amLODipine (NORVASC) 10 MG tablet    Sig: Take 1 tablet (10 mg total) by mouth daily.    Dispense:  90 tablet    Refill:  3    Add refills      Follow up plan: Return in about 6 weeks (around 01/05/2021) for 6 week follow-up anxiety insomnia.  , DO [facility] 11/24/2020, 9:26 AM

## 2020-11-24 NOTE — Assessment & Plan Note (Signed)
Controlled cholesterol on statin and lifestyle Last lipid panel 11/2020  Plan: 1. Continue current meds - Rosuvastatin 10mg  nightly 2. Encourage improved lifestyle - low carb/cholesterol, reduce portion size, continue improving regular exercise

## 2020-11-24 NOTE — Assessment & Plan Note (Signed)
Clinically with secondary anxiety, adjustment disorder Trial on Hydroxyzine 50mg  PRN, previous success F/u 6 weeks on insomnia,anxiety consider other meds

## 2020-11-24 NOTE — Patient Instructions (Addendum)
Thank you for coming to the office today.  Restart the Hydroxyzine 50mg  as needed for anxiety, itching and sleep. Can cut in half if needed.  Refills added to medications.  Lab work looks great, normal sugar, kidney, liver, and cholesterol.   Please schedule a Follow-up Appointment to: Return in about 6 weeks (around 01/05/2021) for 6 week follow-up anxiety insomnia.  If you have any other questions or concerns, please feel free to call the office or send a message through MyChart. You may also schedule an earlier appointment if necessary.  Additionally, you may be receiving a survey about your experience at our office within a few days to 1 week by e-mail or mail. We value your feedback.  01/07/2021, DO [facility], [facility]

## 2020-11-24 NOTE — Assessment & Plan Note (Signed)
Mild elevated DBP today with acute anxiety Home results reviewed No known complications    Plan:  1. Continue Amlodipine 10mg  daily 2. Encourage improved lifestyle - low sodium diet, regular exercise 3. Continue monitor BP outside office, bring readings to next visit, if persistently >140/90 or new symptoms notify office sooner

## 2021-01-01 ENCOUNTER — Ambulatory Visit: Payer: BC Managed Care – PPO | Admitting: Family Medicine

## 2021-02-07 DIAGNOSIS — R220 Localized swelling, mass and lump, head: Secondary | ICD-10-CM | POA: Diagnosis not present

## 2021-02-07 DIAGNOSIS — M7989 Other specified soft tissue disorders: Secondary | ICD-10-CM | POA: Diagnosis not present

## 2021-02-07 DIAGNOSIS — R21 Rash and other nonspecific skin eruption: Secondary | ICD-10-CM | POA: Diagnosis not present

## 2021-02-09 ENCOUNTER — Encounter: Payer: Self-pay | Admitting: Family Medicine

## 2021-02-09 ENCOUNTER — Other Ambulatory Visit: Payer: Self-pay

## 2021-02-09 ENCOUNTER — Ambulatory Visit (INDEPENDENT_AMBULATORY_CARE_PROVIDER_SITE_OTHER): Payer: BC Managed Care – PPO | Admitting: Family Medicine

## 2021-02-09 VITALS — BP 137/81 | HR 77 | Ht 72.0 in | Wt 228.4 lb

## 2021-02-09 DIAGNOSIS — H65492 Other chronic nonsuppurative otitis media, left ear: Secondary | ICD-10-CM

## 2021-02-09 DIAGNOSIS — I8393 Asymptomatic varicose veins of bilateral lower extremities: Secondary | ICD-10-CM | POA: Diagnosis not present

## 2021-02-09 DIAGNOSIS — R600 Localized edema: Secondary | ICD-10-CM

## 2021-02-09 DIAGNOSIS — H9192 Unspecified hearing loss, left ear: Secondary | ICD-10-CM

## 2021-02-09 DIAGNOSIS — J3089 Other allergic rhinitis: Secondary | ICD-10-CM

## 2021-02-09 DIAGNOSIS — I10 Essential (primary) hypertension: Secondary | ICD-10-CM

## 2021-02-09 MED ORDER — FLUTICASONE PROPIONATE 50 MCG/ACT NA SUSP: 2.0000 | Freq: Every day | NASAL | 3 refills | Status: AC

## 2021-02-09 MED ORDER — HYDROCHLOROTHIAZIDE 25 MG PO TABS: 25.0000 mg | ORAL_TABLET | Freq: Every day | ORAL | 1 refills | Status: DC

## 2021-02-09 NOTE — Assessment & Plan Note (Signed)
Likely related to variety of factors Suspected venous insufficiency, varicose veins High sodium intake  Also main issue today on Amlodipine as side effect of swelling Will DC Amlodipine Trial on HCTZ next Follow progress, if unresolved consider Vascular Venous Doppler / Refer to Vascular if indicated

## 2021-02-09 NOTE — Patient Instructions (Addendum)
Thank you for coming to the office today.  Stop taking Amlodipine - this was likely causing your swelling Start Hydrochlorothiazide 25mg  daily, this has a fluid pill component will help with swelling  Also good to avoid excess salt in diet, focus on low sodium diet.  Use RICE therapy: - R - Rest / relative rest with activity modification avoid overuse of joint - I - Ice packs (make sure you use a towel or sock / something to protect skin) - C - Compression with stockings / ACE wrap to apply pressure and reduce swelling allowing more support - E - Elevation - if significant swelling, lift leg above heart level (toes above your nose) to help reduce swelling, most helpful at night after day of being on your feet  Keep upcoming apt for blood work as planned  Start nasal steroid Flonase 2 sprays in each nostril daily for 4-6 weeks, may repeat course seasonally or as needed  If worse hearing loss let me know we can refer to ENT  Please schedule a Follow-up Appointment to: Return if symptoms worsen or fail to improve, for keep apts in October.  If you have any other questions or concerns, please feel free to call the office or send a message through MyChart. You may also schedule an earlier appointment if necessary.  Additionally, you may be receiving a survey about your experience at our office within a few days to 1 week by e-mail or mail. We value your feedback.  November, DO [facility], [facility]

## 2021-02-09 NOTE — Progress Notes (Signed)
Subjective:    Patient ID: Timothy Lang, male    DOB: 01-Jan-1966, 55 y.o.   MRN: [id number]  Timothy Lang is a 55 y.o. male presenting on 02/09/2021 for Leg Swelling and Facial Swelling   HPI  Lower Extremity Edema Varicose veins Reports worse onset in the past 3 weeks with leg swelling Admits some sodium intake, has tried to improve Also with warm weather has had worse swelling Has indentation from socks Not wearing compression No redness or significant pain or ulceration  HTN On Amlodipine 10mg  daily  Reduced Hearing Allergies Sinusitis On Flonase, mixed results some relief but still has reduced hearing. Needs new order on Flonase.  He went to urgent care due to facial / leg swelling recently, needs work note to return Work note to return on 02/13/21  Depression screen [facility] 2/9 02/09/2021 11/24/2020 05/23/2020  Decreased Interest 1 1 0  Down, Depressed, Hopeless 1 1 0  PHQ - 2 Score 2 2 0  Altered sleeping 0 3 -  Tired, decreased energy 2 2 -  Change in appetite 3 1 -  Feeling bad or failure about yourself  1 1 -  Trouble concentrating 1 1 -  Moving slowly or fidgety/restless 2 1 -  Suicidal thoughts 0 0 -  PHQ-9 Score 11 11 -  Difficult doing work/chores Somewhat difficult Somewhat difficult -    Social History   Tobacco Use   Smoking status: Former    Packs/day: 0.50    Pack years: 0.00    Types: Cigarettes    Quit date: 04/15/2016    Years since quitting: 4.8   Smokeless tobacco: Former  06/15/2016 Use: Never used  Substance Use Topics   Alcohol use: No   Drug use: No    Review of Systems Per HPI unless specifically indicated above     Objective:    BP 137/81   Pulse 77   Ht 6' (1.829 m)   Wt 228 lb 6.4 oz (103.6 kg)   SpO2 99%   BMI 30.98 kg/m   Wt Readings from Last 3 Encounters:  02/09/21 228 lb 6.4 oz (103.6 kg)  11/24/20 221 lb 9.6 oz (100.5 kg)  08/25/20 205 lb 0.4 oz (93 kg)    Physical Exam Vitals and nursing note  reviewed.  Constitutional:      General: He is not in acute distress.    Appearance: He is well-developed. He is not diaphoretic.     Comments: Well-appearing, comfortable, cooperative  HENT:     Head: Normocephalic and atraumatic.     Ears:     Comments: Left TM with opaque cloudy mild effusion some improved Eyes:     General:        Right eye: No discharge.        Left eye: No discharge.     Conjunctiva/sclera: Conjunctivae normal.  Neck:     Thyroid: No thyromegaly.  Cardiovascular:     Rate and Rhythm: Normal rate and regular rhythm.     Pulses: Normal pulses.     Heart sounds: Normal heart sounds. No murmur heard. Pulmonary:     Effort: Pulmonary effort is normal. No respiratory distress.     Breath sounds: Normal breath sounds. No wheezing or rales.  Musculoskeletal:        General: Normal range of motion.     Cervical back: Normal range of motion and neck supple.     Right lower leg: Edema (+  1 pitting edema) present.     Left lower leg: Edema (+1 pitting edema) present.     Comments: Varicose veins lower ext  Lymphadenopathy:     Cervical: No cervical adenopathy.  Skin:    General: Skin is warm and dry.     Findings: No erythema or rash.  Neurological:     Mental Status: He is alert and oriented to person, place, and time. Mental status is at baseline.  Psychiatric:        Behavior: Behavior normal.     Comments: Well groomed, good eye contact, normal speech and thoughts      Results for orders placed or performed in visit on 11/20/20  TSH  Result Value Ref Range   TSH 1.33 0.40 - 4.50 mIU/L  PSA  Result Value Ref Range   PSA 0.29 < OR = 4.0 ng/mL  Lipid panel  Result Value Ref Range   Cholesterol 153 <200 mg/dL   HDL 45 > OR = 40 mg/dL   Triglycerides 191 (H) <150 mg/dL   LDL Cholesterol (Calc) 82 mg/dL (calc)   Total CHOL/HDL Ratio 3.4 <5.0 (calc)   Non-HDL Cholesterol (Calc) 108 <130 mg/dL (calc)  COMPLETE METABOLIC PANEL WITH GFR  Result Value Ref  Range   Glucose, Bld 111 (H) 65 - 99 mg/dL   BUN 16 7 - 25 mg/dL   Creat 4.78 2.95 - 6.21 mg/dL   GFR, Est Non African American 93 > OR = 60 mL/min/1.73m2   GFR, Est African American 108 > OR = 60 mL/min/1.11m2   BUN/Creatinine Ratio NOT APPLICABLE 6 - 22 (calc)   Sodium 140 135 - 146 mmol/L   Potassium 3.8 3.5 - 5.3 mmol/L   Chloride 107 98 - 110 mmol/L   CO2 22 20 - 32 mmol/L   Calcium 9.0 8.6 - 10.3 mg/dL   Total Protein 6.7 6.1 - 8.1 g/dL   Albumin 4.4 3.6 - 5.1 g/dL   Globulin 2.3 1.9 - 3.7 g/dL (calc)   AG Ratio 1.9 1.0 - 2.5 (calc)   Total Bilirubin 0.6 0.2 - 1.2 mg/dL   Alkaline phosphatase (APISO) 120 35 - 144 U/L   AST 28 10 - 35 U/L   ALT 38 9 - 46 U/L  CBC with Differential/Platelet  Result Value Ref Range   WBC 6.4 3.8 - 10.8 Thousand/uL   RBC 5.12 4.20 - 5.80 Million/uL   Hemoglobin 14.8 13.2 - 17.1 g/dL   HCT 30.8 65.7 - 84.6 %   MCV 87.9 80.0 - 100.0 fL   MCH 28.9 27.0 - 33.0 pg   MCHC 32.9 32.0 - 36.0 g/dL   RDW 96.2 95.2 - 84.1 %   Platelets 310 140 - 400 Thousand/uL   MPV 9.5 7.5 - 12.5 fL   Neutro Abs 2,957 1,500 - 7,800 cells/uL   Lymphs Abs 2,406 850 - 3,900 cells/uL   Absolute Monocytes 659 200 - 950 cells/uL   Eosinophils Absolute 301 15 - 500 cells/uL   Basophils Absolute 77 0 - 200 cells/uL   Neutrophils Relative % 46.2 %   Total Lymphocyte 37.6 %   Monocytes Relative 10.3 %   Eosinophils Relative 4.7 %   Basophils Relative 1.2 %  Hemoglobin A1c  Result Value Ref Range   Hgb A1c MFr Bld 5.1 <5.7 % of total Hgb   Mean Plasma Glucose 100 mg/dL   eAG (mmol/L) 5.5 mmol/L      Assessment & Plan:   Problem List Items Addressed  This Visit     Essential hypertension - Primary    Normal BP but swelling likely related to Amlodipine  Discontinue Amlodipine 10mg  daily START new HCTZ 25mg  daily Monitor BP and swelling Has upcoming visit for labs / physical       Relevant Medications   hydrochlorothiazide (HYDRODIURIL) 25 MG tablet    Bilateral lower extremity edema    Likely related to variety of factors Suspected venous insufficiency, varicose veins High sodium intake  Also main issue today on Amlodipine as side effect of swelling Will DC Amlodipine Trial on HCTZ next Follow progress, if unresolved consider Vascular Venous Doppler / Refer to Vascular if indicated       Relevant Medications   hydrochlorothiazide (HYDRODIURIL) 25 MG tablet   Other Visit Diagnoses     Asymptomatic varicose veins of both lower extremities       Relevant Medications   hydrochlorothiazide (HYDRODIURIL) 25 MG tablet   Hearing loss of left ear, unspecified hearing loss type       Chronic MEE (middle ear effusion), left       Relevant Medications   fluticasone (FLONASE) 50 MCG/ACT nasal spray   Seasonal allergic rhinitis due to other allergic trigger       Relevant Medications   fluticasone (FLONASE) 50 MCG/ACT nasal spray       Allergies Re order Flonase Should continue to help effusion May refer to ENT if indicated for hearing loss  Meds ordered this encounter  Medications   hydrochlorothiazide (HYDRODIURIL) 25 MG tablet    Sig: Take 1 tablet (25 mg total) by mouth daily.    Dispense:  90 tablet    Refill:  1    Discontinue Amlodipine start HCTZ   fluticasone (FLONASE) 50 MCG/ACT nasal spray    Sig: Place 2 sprays into both nostrils daily.    Dispense:  9.9 mL    Refill:  3     Follow up plan: Return if symptoms worsen or fail to improve, for keep apts in October.   June, DO [facility] 02/09/2021, 9:27 AM

## 2021-02-09 NOTE — Assessment & Plan Note (Signed)
Normal BP but swelling likely related to Amlodipine  Discontinue Amlodipine 10mg  daily START new HCTZ 25mg  daily Monitor BP and swelling Has upcoming visit for labs / physical

## 2021-03-23 ENCOUNTER — Other Ambulatory Visit: Payer: Self-pay | Admitting: Family Medicine

## 2021-03-23 DIAGNOSIS — F4322 Adjustment disorder with anxiety: Secondary | ICD-10-CM

## 2021-05-09 ENCOUNTER — Other Ambulatory Visit: Payer: Self-pay | Admitting: Family Medicine

## 2021-05-09 DIAGNOSIS — R600 Localized edema: Secondary | ICD-10-CM

## 2021-05-09 DIAGNOSIS — I10 Essential (primary) hypertension: Secondary | ICD-10-CM

## 2021-05-28 ENCOUNTER — Other Ambulatory Visit: Payer: BC Managed Care – PPO

## 2021-05-31 ENCOUNTER — Ambulatory Visit: Payer: BC Managed Care – PPO | Admitting: Family Medicine

## 2021-06-01 ENCOUNTER — Encounter: Payer: Self-pay | Admitting: Family Medicine

## 2021-06-01 ENCOUNTER — Other Ambulatory Visit: Payer: Self-pay | Admitting: Family Medicine

## 2021-06-01 ENCOUNTER — Other Ambulatory Visit: Payer: Self-pay

## 2021-06-01 ENCOUNTER — Ambulatory Visit (INDEPENDENT_AMBULATORY_CARE_PROVIDER_SITE_OTHER): Payer: BC Managed Care – PPO | Admitting: Family Medicine

## 2021-06-01 VITALS — BP 126/84 | HR 68 | Ht 72.0 in | Wt 230.4 lb

## 2021-06-01 DIAGNOSIS — Z125 Encounter for screening for malignant neoplasm of prostate: Secondary | ICD-10-CM

## 2021-06-01 DIAGNOSIS — I10 Essential (primary) hypertension: Secondary | ICD-10-CM | POA: Diagnosis not present

## 2021-06-01 DIAGNOSIS — F4322 Adjustment disorder with anxiety: Secondary | ICD-10-CM | POA: Diagnosis not present

## 2021-06-01 DIAGNOSIS — F5104 Psychophysiologic insomnia: Secondary | ICD-10-CM | POA: Diagnosis not present

## 2021-06-01 DIAGNOSIS — E782 Mixed hyperlipidemia: Secondary | ICD-10-CM

## 2021-06-01 DIAGNOSIS — R600 Localized edema: Secondary | ICD-10-CM

## 2021-06-01 DIAGNOSIS — E669 Obesity, unspecified: Secondary | ICD-10-CM

## 2021-06-01 DIAGNOSIS — Z0000 Encounter for general adult medical examination without abnormal findings: Secondary | ICD-10-CM

## 2021-06-01 DIAGNOSIS — R7309 Other abnormal glucose: Secondary | ICD-10-CM

## 2021-06-01 MED ORDER — HYDROXYZINE HCL 50 MG PO TABS: 50.0000 mg | ORAL_TABLET | Freq: Three times a day (TID) | ORAL | 3 refills | Status: DC | PRN

## 2021-06-01 MED ORDER — HYDROCHLOROTHIAZIDE 25 MG PO TABS: 25.0000 mg | ORAL_TABLET | Freq: Every day | ORAL | 3 refills | Status: DC

## 2021-06-01 NOTE — Progress Notes (Signed)
Subjective:    Patient ID: Timothy Lang, male    DOB: Nov 11, 1965, 55 y.o.   MRN: [id number]  Timothy Lang is a 55 y.o. male presenting on 06/01/2021 for Hypertension   HPI  CHRONIC HTN: Reports still pharmacy filling the Amlodipine incorrectly. He was switched off of it due to fluid retention before. He is doing well on thiazide RESOLVED Edema off Amlodipine Current Meds - HCTZ 25mg  daily needed re order   Reports good compliance, took meds today. Tolerating well, w/o complaints. Denies CP, dyspnea, HA, edema, dizziness / lightheadedness  Anxiety On Hydroxyzine 50mg  QHS PRN, can use during day if need, will re fill doing better  Weight Gain Goal to resume exercise, now 9 lb wt gain in 6 months.   Health Maintenance: Considering Shingrix  Depression screen [facility] 2/9 06/01/2021 02/09/2021 11/24/2020  Decreased Interest 1 1 1   Down, Depressed, Hopeless 1 1 1   PHQ - 2 Score 2 2 2   Altered sleeping 1 0 3  Tired, decreased energy 2 2 2   Change in appetite 3 3 1   Feeling bad or failure about yourself  1 1 1   Trouble concentrating 1 1 1   Moving slowly or fidgety/restless 1 2 1   Suicidal thoughts 0 0 0  PHQ-9 Score 11 11 11   Difficult doing work/chores Somewhat difficult Somewhat difficult Somewhat difficult    Social History   Tobacco Use   Smoking status: Former    Packs/day: 0.50    Types: Cigarettes    Quit date: 04/15/2016    Years since quitting: 5.1   Smokeless tobacco: Former  11/26/2020 Use: Never used  Substance Use Topics   Alcohol use: No   Drug use: No    Review of Systems Per HPI unless specifically indicated above     Objective:    BP 126/84 (BP Location: Left Arm, Cuff Size: Normal)   Pulse 68   Ht 6' (1.829 m)   Wt 230 lb 6.4 oz (104.5 kg)   SpO2 97%   BMI 31.25 kg/m   Wt Readings from Last 3 Encounters:  06/01/21 230 lb 6.4 oz (104.5 kg)  02/09/21 228 lb 6.4 oz (103.6 kg)  11/24/20 221 lb 9.6 oz (100.5 kg)    Physical  Exam Vitals and nursing note reviewed.  Constitutional:      General: He is not in acute distress.    Appearance: He is well-developed. He is not diaphoretic.     Comments: Well-appearing, comfortable, cooperative  HENT:     Head: Normocephalic and atraumatic.  Eyes:     General:        Right eye: No discharge.        Left eye: No discharge.     Conjunctiva/sclera: Conjunctivae normal.  Neck:     Thyroid: No thyromegaly.  Cardiovascular:     Rate and Rhythm: Normal rate and regular rhythm.     Pulses: Normal pulses.     Heart sounds: Normal heart sounds. No murmur heard. Pulmonary:     Effort: Pulmonary effort is normal. No respiratory distress.     Breath sounds: Normal breath sounds. No wheezing or rales.  Musculoskeletal:        General: Normal range of motion.     Cervical back: Normal range of motion and neck supple.     Right lower leg: No edema.     Left lower leg: No edema.  Lymphadenopathy:     Cervical: No cervical  adenopathy.  Skin:    General: Skin is warm and dry.     Findings: No erythema or rash.  Neurological:     Mental Status: He is alert and oriented to person, place, and time. Mental status is at baseline.  Psychiatric:        Behavior: Behavior normal.     Comments: Well groomed, good eye contact, normal speech and thoughts      Results for orders placed or performed in visit on 11/20/20  TSH  Result Value Ref Range   TSH 1.33 0.40 - 4.50 mIU/L  PSA  Result Value Ref Range   PSA 0.29 < OR = 4.0 ng/mL  Lipid panel  Result Value Ref Range   Cholesterol 153 <200 mg/dL   HDL 45 > OR = 40 mg/dL   Triglycerides 836 (H) <150 mg/dL   LDL Cholesterol (Calc) 82 mg/dL (calc)   Total CHOL/HDL Ratio 3.4 <5.0 (calc)   Non-HDL Cholesterol (Calc) 108 <130 mg/dL (calc)  COMPLETE METABOLIC PANEL WITH GFR  Result Value Ref Range   Glucose, Bld 111 (H) 65 - 99 mg/dL   BUN 16 7 - 25 mg/dL   Creat 6.29 4.76 - 5.46 mg/dL   GFR, Est Non African American 93 >  OR = 60 mL/min/1.18m2   GFR, Est African American 108 > OR = 60 mL/min/1.25m2   BUN/Creatinine Ratio NOT APPLICABLE 6 - 22 (calc)   Sodium 140 135 - 146 mmol/L   Potassium 3.8 3.5 - 5.3 mmol/L   Chloride 107 98 - 110 mmol/L   CO2 22 20 - 32 mmol/L   Calcium 9.0 8.6 - 10.3 mg/dL   Total Protein 6.7 6.1 - 8.1 g/dL   Albumin 4.4 3.6 - 5.1 g/dL   Globulin 2.3 1.9 - 3.7 g/dL (calc)   AG Ratio 1.9 1.0 - 2.5 (calc)   Total Bilirubin 0.6 0.2 - 1.2 mg/dL   Alkaline phosphatase (APISO) 120 35 - 144 U/L   AST 28 10 - 35 U/L   ALT 38 9 - 46 U/L  CBC with Differential/Platelet  Result Value Ref Range   WBC 6.4 3.8 - 10.8 Thousand/uL   RBC 5.12 4.20 - 5.80 Million/uL   Hemoglobin 14.8 13.2 - 17.1 g/dL   HCT 50.3 54.6 - 56.8 %   MCV 87.9 80.0 - 100.0 fL   MCH 28.9 27.0 - 33.0 pg   MCHC 32.9 32.0 - 36.0 g/dL   RDW 12.7 51.7 - 00.1 %   Platelets 310 140 - 400 Thousand/uL   MPV 9.5 7.5 - 12.5 fL   Neutro Abs 2,957 1,500 - 7,800 cells/uL   Lymphs Abs 2,406 850 - 3,900 cells/uL   Absolute Monocytes 659 200 - 950 cells/uL   Eosinophils Absolute 301 15 - 500 cells/uL   Basophils Absolute 77 0 - 200 cells/uL   Neutrophils Relative % 46.2 %   Total Lymphocyte 37.6 %   Monocytes Relative 10.3 %   Eosinophils Relative 4.7 %   Basophils Relative 1.2 %  Hemoglobin A1c  Result Value Ref Range   Hgb A1c MFr Bld 5.1 <5.7 % of total Hgb   Mean Plasma Glucose 100 mg/dL   eAG (mmol/L) 5.5 mmol/L      Assessment & Plan:   Problem List Items Addressed This Visit     Psychophysiological insomnia   Relevant Medications   hydrOXYzine (ATARAX/VISTARIL) 50 MG tablet   Obesity (BMI 30.0-34.9)   Essential hypertension - Primary   Relevant Medications  hydrochlorothiazide (HYDRODIURIL) 25 MG tablet   Bilateral lower extremity edema   Relevant Medications   hydrochlorothiazide (HYDRODIURIL) 25 MG tablet   Adjustment disorder with anxiety   Relevant Medications   hydrOXYzine (ATARAX/VISTARIL) 50 MG  tablet    HTN Controlled on HCTZ 25mg  daily Again try to DC Amlodipine, he will check w/ pharmacy Edema controlled now off Amlodipine  Anxiety / Adjustment Insomnia Re order Hydroxyzine 50mg  PRN   Meds ordered this encounter  Medications   hydrochlorothiazide (HYDRODIURIL) 25 MG tablet    Sig: Take 1 tablet (25 mg total) by mouth daily.    Dispense:  90 tablet    Refill:  3    Discontinue Amlodipine, remove from list   hydrOXYzine (ATARAX/VISTARIL) 50 MG tablet    Sig: Take 1 tablet (50 mg total) by mouth 3 (three) times daily as needed for anxiety.    Dispense:  90 tablet    Refill:  3      Follow up plan: Return in about 6 months (around 11/30/2021) for 6 month fasting lab only then 1 week later Annual Physical.  Future labs  6 month ordered  , DO [facility] 06/01/2021, 8:58 AM

## 2021-06-01 NOTE — Patient Instructions (Addendum)
Thank you for coming to the office today.  COVID19 Vaccine at pharmacy when ready.  Consider Shingles vaccine, 2 doses 2-6 month apart.  Discontinue Amlodipine 10mg  - make sure pharmacy does not fill this and removes it from the list.  Continue the Hydrochlorothiazide 25mg  daily  Refilled Hydroxyzine 50mg  as needed for anxiety and sleep.  DUE for FASTING BLOOD WORK (no food or drink after midnight before the lab appointment, only water or coffee without cream/sugar on the morning of)  SCHEDULE "Lab Only" visit in the morning at the clinic for lab draw in 6 MONTHS   - Make sure Lab Only appointment is at about 1 week before your next appointment, so that results will be available  For Lab Results, once available within 2-3 days of blood draw, you can can log in to MyChart online to view your results and a brief explanation. Also, we can discuss results at next follow-up visit. '  Please schedule a Follow-up Appointment to: Return in about 6 months (around 11/30/2021) for 6 month fasting lab only then 1 week later Annual Physical.  If you have any other questions or concerns, please feel free to call the office or send a message through MyChart. You may also schedule an earlier appointment if necessary.  Additionally, you may be receiving a survey about your experience at our office within a few days to 1 week by e-mail or mail. We value your feedback.  , DO [facility], 12/02/2021

## 2021-09-01 IMAGING — CR DG CHEST 2V
1 series · 1 of 1 positions shown · non-contrast
Comparison: No priors.

CLINICAL DATA: 54-year-old male with history of shortness of
breath. Recently diagnosed with COVID infection.

EXAM:
CHEST - 2 VIEW

[chest lat]
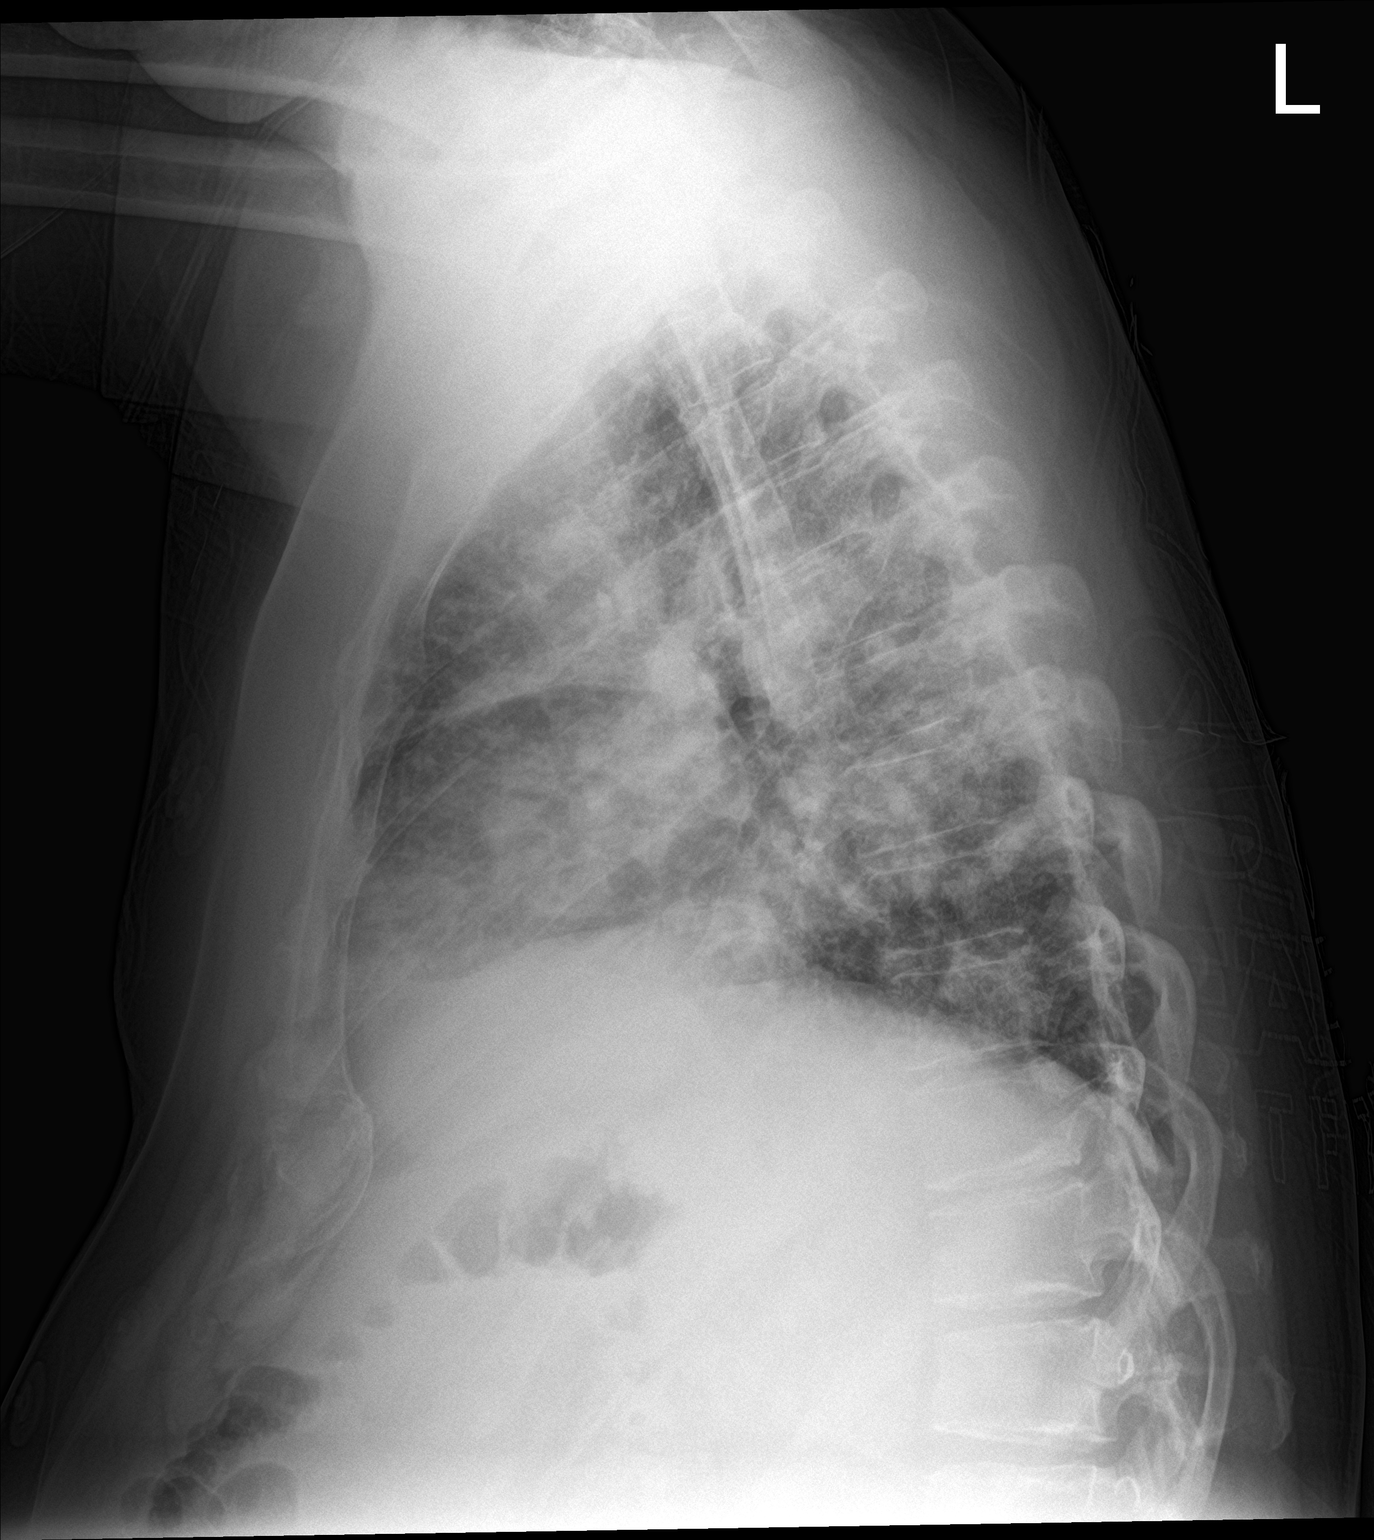

[1 of 1 positions shown; findings below may reference images not displayed]

FINDINGS: Lung volumes are low. Patchy multifocal airspace disease and areas
of interstitial prominence throughout the lungs bilaterally, most
evident throughout the mid to lower lungs. No pneumothorax. No
evidence of pulmonary edema. No pleural effusions. Heart size is
normal. Upper mediastinal contours are within normal limits.
IMPRESSION: 1. Severe multilobar bilateral pneumonia compatible with reported
COVID infection, as above.

## 2021-09-13 ENCOUNTER — Telehealth: Payer: Self-pay | Admitting: Family Medicine

## 2021-09-13 ENCOUNTER — Ambulatory Visit: Payer: Self-pay | Admitting: *Deleted

## 2021-09-13 DIAGNOSIS — E291 Testicular hypofunction: Secondary | ICD-10-CM

## 2021-09-13 DIAGNOSIS — M545 Low back pain, unspecified: Secondary | ICD-10-CM

## 2021-09-13 MED ORDER — TRAMADOL HCL 50 MG PO TABS: 50.0000 mg | ORAL_TABLET | Freq: Three times a day (TID) | ORAL | 0 refills | Status: DC | PRN

## 2021-09-13 MED ORDER — CYCLOBENZAPRINE HCL 10 MG PO TABS: 10.0000 mg | ORAL_TABLET | Freq: Two times a day (BID) | ORAL | 1 refills | Status: DC | PRN

## 2021-09-13 NOTE — Telephone Encounter (Signed)
Called patient back. He has had similar acute back pain flare in past 2020, documented, he did improve on flexeril and short course tramadol. Agreed to call these in and I advised him to schedule apt to follow up on his back pain.   Added testosterone lab by his request for 4/17 labs.  Saralyn Pilar, DO [facility] 09/13/2021, 5:31 PM

## 2021-09-13 NOTE — Telephone Encounter (Signed)
Per agent:  "The patient had back discomfort roughly a year ago in their lower back   The patient bent down this morning and believes they may have pulled something in their back   The patient would like to be prescribed something for their discomfort but has declined to schedule an appt   The patient would like to speak with a member of staff when possible."   Chief Complaint: Lower back pain Symptoms: Bent over to pick up package and felt catch. "Same thing happened last year, same pain, same place" Frequency: This AM Pertinent Negatives: Patient denies  Disposition: [] ED /[] Urgent Care (no appt availability in office) / [] Appointment(In office/virtual)/ []  [facility]/ [] Home Care/ [x] Refused Recommended Disposition /[] [facility]/ []  Follow-up with PCP Additional Notes:  Pt declines appt, UC. States cannot afford either. Requesting "Same medication he prescribed last year when this same thing happened." Advised would need appt,declines. "Even if he just calls in a few to get me past this."  Please advise. Reason for Disposition  [1] SEVERE back pain (e.g., excruciating, unable to do any normal activities) AND [2] not improved 2 hours after pain medicine  Answer Assessment - Initial Assessment Questions 1. ONSET: "When did the pain begin?"      This AM 2. LOCATION: "Where does it hurt?" (upper, mid or lower back)     Lower back 3. SEVERITY: "How bad is the pain?"  (e.g., Scale 1-10; mild, moderate, or severe)   - MILD (1-3): doesn't interfere with normal activities    - MODERATE (4-7): interferes with normal activities or awakens from sleep    - SEVERE (8-10): excruciating pain, unable to do any normal activities      8-9/10 4. PATTERN: "Is the pain constant?" (e.g., yes, no; constant, intermittent)      Constant 5. RADIATION: "Does the pain shoot into your legs or elsewhere?"     Yes, mild, right leg 6. CAUSE:  "What do you think is causing the back  pain?"      Lifted a package, bent over 7. BACK OVERUSE:  "Any recent lifting of heavy objects, strenuous work or exercise?"     yes 8. MEDICATIONS: "What have you taken so far for the pain?" (e.g., nothing, acetaminophen, NSAIDS)     none 9. NEUROLOGIC SYMPTOMS: "Do you have any weakness, numbness, or problems with bowel/bladder control?"     no 10. OTHER SYMPTOMS: "Do you have any other symptoms?" (e.g., fever, abdominal pain, burning with urination, blood in urine)       No  Protocols used: Back Pain-A-AH

## 2021-09-13 NOTE — Telephone Encounter (Signed)
Pt is calling back trying to follow up on medication for lower back pain requested when he spoke with NT this morning.   Pt requesting a call back today if possible stated he is in pain.

## 2021-09-13 NOTE — Telephone Encounter (Signed)
Dr. Kirtland Bouchard has already been sent a message in regards to this. He is out of the office this afternoon but checks his messages throughout the day.

## 2021-10-26 ENCOUNTER — Other Ambulatory Visit: Payer: Self-pay | Admitting: Family Medicine

## 2021-10-26 DIAGNOSIS — F5104 Psychophysiologic insomnia: Secondary | ICD-10-CM

## 2021-10-26 DIAGNOSIS — F4322 Adjustment disorder with anxiety: Secondary | ICD-10-CM

## 2021-10-29 NOTE — Telephone Encounter (Signed)
Requested Prescriptions  ?Pending Prescriptions Disp Refills  ?? hydrOXYzine (ATARAX) 50 MG tablet [Pharmacy Med Name: HYDROXYZINE HCL 50MG  TABS (WHITE)] 90 tablet 3  ?  Sig: TAKE 1 TABLET(50 MG) BY MOUTH THREE TIMES DAILY AS NEEDED FOR ANXIETY  ?  ? Ear, Nose, and Throat:  Antihistamines 2 Passed - 10/26/2021  8:19 PM  ?  ?  Passed - Cr in normal range and within 360 days  ?  Creat  ?Date Value Ref Range Status  ?11/20/2020 0.92 0.70 - 1.33 mg/dL Final  ?  Comment:  ?  For patients >25 years of age, the reference limit ?for Creatinine is approximately 13% higher for people ?identified as African-American. ?. ?  ?   ?  ?  Passed - Valid encounter within last 12 months  ?  Recent Outpatient Visits   ?      ? 5 months ago Essential hypertension  ? [place], DO  ? 8 months ago Essential hypertension  ? [place], DO  ? 11 months ago Annual physical exam  ? [place], DO  ? 1 year ago Acute pharyngitis, unspecified etiology  ? [place], DO  ? 1 year ago Mixed hyperlipidemia  ? [place], DO  ?  ?  ?Future Appointments   ?        ? In 1 month Karamalegos, Devonne Doughty, DO [facility], [place]  ?  ? ?  ?  ?  ? ?

## 2021-11-21 ENCOUNTER — Other Ambulatory Visit: Payer: Self-pay | Admitting: Family Medicine

## 2021-11-21 DIAGNOSIS — E782 Mixed hyperlipidemia: Secondary | ICD-10-CM

## 2021-11-21 NOTE — Telephone Encounter (Signed)
Requested medication (s) are due for refill today: yes  ? ?Requested medication (s) are on the active medication list: yes ? ?Last refill:  11/24/20 #90 3 refills ? ?Future visit scheduled: yes in 1 week ? ?Notes to clinic:  protocol failed. Last labs 11/20/20. Do you want to refill rx ? ? ? ?  ?Requested Prescriptions  ?Pending Prescriptions Disp Refills  ? rosuvastatin (CRESTOR) 10 MG tablet [Pharmacy Med Name: ROSUVASTATIN 10MG  TABLETS] 90 tablet 3  ?  Sig: TAKE 1 TABLET(10 MG) BY MOUTH AT BEDTIME  ?  ? Cardiovascular:  Antilipid - Statins 2 Failed - 11/21/2021  3:18 AM  ?  ?  Failed - Cr in normal range and within 360 days  ?  Creat  ?Date Value Ref Range Status  ?11/20/2020 0.92 0.70 - 1.33 mg/dL Final  ?  Comment:  ?  For patients >72 years of age, the reference limit ?for Creatinine is approximately 13% higher for people ?identified as African-American. ?. ?  ?  ?  ?  ?  Failed - Lipid Panel in normal range within the last 12 months  ?  Cholesterol  ?Date Value Ref Range Status  ?11/20/2020 153 <200 mg/dL Final  ? ?LDL Cholesterol (Calc)  ?Date Value Ref Range Status  ?11/20/2020 82 mg/dL (calc) Final  ?  Comment:  ?  Reference range: <100 ?01/20/2021 ?Desirable range <100 mg/dL for primary prevention;   ?<70 mg/dL for patients with CHD or diabetic patients  ?with > or = 2 CHD risk factors. ?. ?LDL-C is now calculated using the Martin-Hopkins  ?calculation, which is a validated novel method providing  ?better accuracy than the Friedewald equation in the  ?estimation of LDL-C.  ?Marland Kitchen et al. Horald Pollen. Lenox Ahr): 2061-2068  ?(http://education.[employer].com/faq/FAQ164) ?  ? ?HDL  ?Date Value Ref Range Status  ?11/20/2020 45 > OR = 40 mg/dL Final  ? ?Triglycerides  ?Date Value Ref Range Status  ?11/20/2020 161 (H) <150 mg/dL Final  ? ?  ?  ?  Passed - Patient is not pregnant  ?  ?  Passed - Valid encounter within last 12 months  ?  Recent Outpatient Visits   ? ?      ? 5 months ago Essential hypertension  ? [facility] [place], [place], DO  ? 9 months ago Essential hypertension  ? [facility], DO  ? 12 months ago Annual physical exam  ? [facility], DO  ? 1 year ago Acute pharyngitis, unspecified etiology  ? [facility] [place], [place], DO  ? 1 year ago Mixed hyperlipidemia  ? [facility], DO  ? ?  ?  ?Future Appointments   ? ?        ? In 1 week Smitty Cords Althea Charon, DO [facility]  ? ?  ? ?  ?  ?  ? ?

## 2021-11-22 ENCOUNTER — Other Ambulatory Visit: Payer: Self-pay | Admitting: Family Medicine

## 2021-11-22 DIAGNOSIS — J3089 Other allergic rhinitis: Secondary | ICD-10-CM

## 2021-11-22 NOTE — Telephone Encounter (Signed)
Requested Prescriptions  ?Pending Prescriptions Disp Refills  ?? montelukast (SINGULAIR) 10 MG tablet [Pharmacy Med Name: MONTELUKAST 10MG  TABLETS] 90 tablet 3  ?  Sig: TAKE 1 TABLET(10 MG) BY MOUTH AT BEDTIME  ?  ? Pulmonology:  Leukotriene Inhibitors Passed - 11/22/2021  7:13 AM  ?  ?  Passed - Valid encounter within last 12 months  ?  Recent Outpatient Visits   ?      ? 5 months ago Essential hypertension  ? [place], DO  ? 9 months ago Essential hypertension  ? [place], DO  ? 12 months ago Annual physical exam  ? [place], DO  ? 1 year ago Acute pharyngitis, unspecified etiology  ? [place], DO  ? 1 year ago Mixed hyperlipidemia  ? [place], DO  ?  ?  ?Future Appointments   ?        ? In 1 week [place] Devonne Doughty, DO [facility], [place]  ?  ? ?  ?  ?  ? ? ?

## 2021-11-26 ENCOUNTER — Other Ambulatory Visit: Payer: BC Managed Care – PPO

## 2021-11-26 DIAGNOSIS — Z125 Encounter for screening for malignant neoplasm of prostate: Secondary | ICD-10-CM | POA: Diagnosis not present

## 2021-11-26 DIAGNOSIS — E291 Testicular hypofunction: Secondary | ICD-10-CM

## 2021-11-26 DIAGNOSIS — Z0000 Encounter for general adult medical examination without abnormal findings: Secondary | ICD-10-CM

## 2021-11-26 DIAGNOSIS — E669 Obesity, unspecified: Secondary | ICD-10-CM

## 2021-11-26 DIAGNOSIS — E782 Mixed hyperlipidemia: Secondary | ICD-10-CM | POA: Diagnosis not present

## 2021-11-26 DIAGNOSIS — I10 Essential (primary) hypertension: Secondary | ICD-10-CM | POA: Diagnosis not present

## 2021-11-26 DIAGNOSIS — R7309 Other abnormal glucose: Secondary | ICD-10-CM | POA: Diagnosis not present

## 2021-11-27 LAB — LIPID PANEL
Cholesterol: 164 mg/dL (ref <200)
HDL: 47 mg/dL (ref >=40)
LDL Cholesterol (Calc): 87 mg/dL (calc)
Non-HDL Cholesterol (Calc): 117 mg/dL (calc) (ref <130)
Total CHOL/HDL Ratio: 3.5 (calc) (ref <5.0)
Triglycerides: 201 mg/dL — ABNORMAL HIGH (ref <150)

## 2021-11-27 LAB — CBC WITH DIFFERENTIAL/PLATELET
Absolute Monocytes: 717 cells/uL (ref 200–950)
Basophils Absolute: 77 cells/uL (ref 0–200)
Basophils Relative: 1.2 %
Eosinophils Absolute: 147 cells/uL (ref 15–500)
Eosinophils Relative: 2.3 %
HCT: 48.3 % (ref 38.5–50.0)
Hemoglobin: 16.3 g/dL (ref 13.2–17.1)
Lymphs Abs: 2458 cells/uL (ref 850–3900)
MCH: 29.1 pg (ref 27.0–33.0)
MCHC: 33.7 g/dL (ref 32.0–36.0)
MCV: 86.3 fL (ref 80.0–100.0)
MPV: 9.4 fL (ref 7.5–12.5)
Monocytes Relative: 11.2 %
Neutro Abs: 3002 cells/uL (ref 1500–7800)
Neutrophils Relative %: 46.9 %
Platelets: 294 10*3/uL (ref 140–400)
RBC: 5.6 10*6/uL (ref 4.20–5.80)
RDW: 13.1 % (ref 11.0–15.0)
Total Lymphocyte: 38.4 %
WBC: 6.4 10*3/uL (ref 3.8–10.8)

## 2021-11-27 LAB — COMPLETE METABOLIC PANEL WITH GFR
AG Ratio: 1.9 (calc) (ref 1.0–2.5)
ALT: 58 U/L — ABNORMAL HIGH (ref 9–46)
AST: 35 U/L (ref 10–35)
Albumin: 4.5 g/dL (ref 3.6–5.1)
Alkaline phosphatase (APISO): 123 U/L (ref 35–144)
BUN: 14 mg/dL (ref 7–25)
CO2: 24 mmol/L (ref 20–32)
Calcium: 9.3 mg/dL (ref 8.6–10.3)
Chloride: 105 mmol/L (ref 98–110)
Creat: 1.01 mg/dL (ref 0.70–1.30)
Globulin: 2.4 g/dL (calc) (ref 1.9–3.7)
Glucose, Bld: 90 mg/dL (ref 65–99)
Potassium: 4.1 mmol/L (ref 3.5–5.3)
Sodium: 139 mmol/L (ref 135–146)
Total Bilirubin: 0.5 mg/dL (ref 0.2–1.2)
Total Protein: 6.9 g/dL (ref 6.1–8.1)
eGFR: 87 mL/min/{1.73_m2} (ref >=60)

## 2021-11-27 LAB — HEMOGLOBIN A1C
Hgb A1c MFr Bld: 5.6 % of total Hgb (ref <5.7)
Mean Plasma Glucose: 114 mg/dL
eAG (mmol/L): 6.3 mmol/L

## 2021-11-27 LAB — TESTOSTERONE: Testosterone: 451 ng/dL (ref 250–827)

## 2021-11-27 LAB — TSH: TSH: 1.77 mIU/L (ref 0.40–4.50)

## 2021-11-27 LAB — PSA: PSA: 0.3 ng/mL (ref <=4.00)

## 2021-11-30 ENCOUNTER — Ambulatory Visit (INDEPENDENT_AMBULATORY_CARE_PROVIDER_SITE_OTHER): Payer: BC Managed Care – PPO | Admitting: Physician Assistant

## 2021-11-30 VITALS — BP 128/72 | HR 86 | Ht 72.0 in | Wt 237.6 lb

## 2021-11-30 DIAGNOSIS — Z0000 Encounter for general adult medical examination without abnormal findings: Secondary | ICD-10-CM | POA: Diagnosis not present

## 2021-11-30 DIAGNOSIS — Z1211 Encounter for screening for malignant neoplasm of colon: Secondary | ICD-10-CM | POA: Diagnosis not present

## 2021-11-30 DIAGNOSIS — F5104 Psychophysiologic insomnia: Secondary | ICD-10-CM | POA: Diagnosis not present

## 2021-11-30 DIAGNOSIS — I10 Essential (primary) hypertension: Secondary | ICD-10-CM | POA: Diagnosis not present

## 2021-11-30 NOTE — Assessment & Plan Note (Signed)
Chronic, stable, historic condition ?Appears to be sleep initiation dysfunction ?Reviewed sleep hygiene and trying Melatonin at bedtime to see if this is beneficial  ?Recommend follow up as needed for persistent symptoms ?

## 2021-11-30 NOTE — Patient Instructions (Addendum)
To help with the nocturnal bathroom trips, try taking the Hydrochlorothiazide in the mornings. This pill is a diuretic and can cause increased urinary frequency as a way to help control your blood pressure. If this doesn't help you can return to your normal dosing schedule.  ? ?For sleep initiation I recommend the following: ? ?Sleep hygiene (Practices to help improve sleep) ? ?*Try to go to bed at the same time every night ?*Make your room as dark and quiet as possible for sleep (ambient noises or sleep machine is okay too) ?*Try to establish a bedtime routine before bed (shower, reading for a little bit, etc.) ?*No TVs or phone for at least 20min to an hour before bed. (The blue light from these devices can affect your sleep-wake cycle and make it harder to go to sleep) ?*Avoid caffeine in the afternoon and especially before bedtime. ? ?The goal is to establish a relaxing environment and routine that signals your body that it is time to go to sleep.  ? ? ?It was nice to meet you and I appreciate the opportunity to be involved in your care ? ?

## 2021-11-30 NOTE — Assessment & Plan Note (Signed)
Chronic, historic condition ?Appears to be stable and well managed on HCTZ 25 mg PO QD  ?Continue current medications ?Follow up in 6 months for monitoring  ? ?

## 2021-11-30 NOTE — Progress Notes (Signed)
? ?     Annual Physical Exam  ? ?Name: Timothy Lang   MRN: [id number]    DOB: [birth year]   Date:11/30/2021 ? 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  ?     ? ?Subjective ? ?Chief Complaint ? ?Chief Complaint  ?Patient presents with  ? Annual Exam  ? ? ?HPI ? ?Patient presents for annual CPE . ? ?IPSS Questionnaire (AUA-7): ?Over the past month?   ?1)  How often have you had a sensation of not emptying your bladder completely after you finish urinating?  0 - Not at all  ?2)  How often have you had to urinate again less than two hours after you finished urinating? 1 - Less than 1 time in 5  ?3)  How often have you found you stopped and started again several times when you urinated?  0 - Not at all  ?4) How difficult have you found it to postpone urination?  0 - Not at all  ?5) How often have you had a weak urinary stream?  1 - Less than 1 time in 5  ?6) How often have you had to push or strain to begin urination?  0 - Not at all  ?7) How many times did you most typically get up to urinate from the time you went to bed until the time you got up in the morning?  3 - 3 times  ?Total score:  0-7 mildly symptomatic  ? 8-19 moderately symptomatic  ? 20-35 severely symptomatic  ?  ? ?Diet: Working on it. Trying to eat more salads, has stopped drinking sodas with the exception of one Monster per day ?Exercise: Started going to the gym 3 days per week, 2 weeks ago.  ?Sleep:Finds it hard to go to sleep. Has to wake up a few times to go to the restroom during the night  ?Mood: ? ?Depression: phq 9 is negative ? ?  11/30/2021  ?  8:52 AM 06/01/2021  ?  8:54 AM 02/09/2021  ?  9:53 AM 11/24/2020  ?  9:36 AM 05/23/2020  ? 10:58 AM  ?Depression screen PHQ 2/9  ?Decreased Interest 2 1 1 1  0  ?Down, Depressed, Hopeless 1 1 1 1  0  ?PHQ - 2 Score 3 2 2 2  0  ?Altered sleeping 3 1 0 3   ?Tired, decreased energy 2 2 2 2    ?Change in appetite 0 3 3  1    ?Feeling bad or failure about yourself  0 1 1 1    ?Trouble concentrating 0 1 1 1    ?Moving slowly or fidgety/restless 0 1 2 1    ?Suicidal thoughts 0 0 0 0   ?PHQ-9 Score 8 11 11 11    ?Difficult doing work/chores Not difficult at all Somewhat difficult Somewhat difficult Somewhat difficult   ? ? ?Hypertension:  ?BP Readings from Last 3 Encounters:  ?11/30/21 128/72  ?06/01/21 126/84  ?02/09/21 137/81  ? ? ?Obesity: ?Wt Readings from Last 3 Encounters:  ?11/30/21 237 lb 9.6 oz (107.8 kg)  ?06/01/21 230 lb 6.4 oz (104.5 kg)  ?02/09/21 228 lb 6.4 oz (103.6 kg)  ? ?BMI Readings from Last 3 Encounters:  ?11/30/21 32.22 kg/m?  ?06/01/21 31.25 kg/m?  ?02/09/21 30.98 kg/m?  ?  ? ?Lipids:  ?Lab Results  ?Component Value Date  ? CHOL 164 11/26/2021  ? CHOL 153 11/20/2020  ? CHOL 134 05/15/2020  ? ?  Lab Results  ?Component Value Date  ? HDL 47 11/26/2021  ? HDL 45 11/20/2020  ? HDL 46 05/15/2020  ? ?Lab Results  ?Component Value Date  ? LDLCALC 87 11/26/2021  ? LDLCALC 82 11/20/2020  ? LDLCALC 70 05/15/2020  ? ?Lab Results  ?Component Value Date  ? TRIG 201 (H) 11/26/2021  ? TRIG 161 (H) 11/20/2020  ? TRIG 101 08/16/2020  ? ?Lab Results  ?Component Value Date  ? CHOLHDL 3.5 11/26/2021  ? CHOLHDL 3.4 11/20/2020  ? CHOLHDL 2.9 05/15/2020  ? ?No results found for: LDLDIRECT ?Glucose:  ?Glucose, Bld  ?Date Value Ref Range Status  ?11/26/2021 90 65 - 99 mg/dL Final  ?  Comment:  ?  . ?           Fasting reference interval ?. ?  ?11/20/2020 111 (H) 65 - 99 mg/dL Final  ?  Comment:  ?  . ?           Fasting reference interval ?. ?For someone without known diabetes, a glucose value ?between 100 and 125 mg/dL is consistent with ?prediabetes and should be confirmed with a ?follow-up test. ?. ?  ?08/28/2020 80 70 - 99 mg/dL Final  ?  Comment:  ?  Glucose reference range applies only to samples taken after fasting for at least 8 hours.  ? ? ? ? ?Single ?STD testing and prevention (HIV/chl/gon/syphilis):  not applicable ?Hep C Screening:  not interested today  ?Skin cancer: Discussed monitoring for atypical lesions ?Colorectal cancer: Next due 01/05/2022 ?Prostate cancer:  yes ?Lab Results  ?Component Value Date  ? PSA 0.30 11/26/2021  ? PSA 0.29 11/20/2020  ? PSA 0.4 10/20/2018  ? ? ? ?Lung cancer:  Low Dose CT Chest recommended if Age 3-80 years, 30 pack-year currently smoking OR have quit w/in 15years. Patient  no ?AAA: The USPSTF recommends one-time screening with ultrasonography in men ages 5 to 75 years who have ever smoked. Patient:  not applicable ?ECG:  not applicable today  ? ?Vaccines:  ? ?HPV: aged out  ?Tdap: Up to date  ?Shingrix: not interested in pursuing at this time  ?Pneumonia: not due today  ?Flu: Next due in fall of 2023  ?COVID-19:Never done  ? ?Advanced Care Planning: A voluntary discussion about advance care planning including the explanation and discussion of advance directives.  Discussed health care proxy and Living will, and the patient was able to identify a health care proxy as Timothy Lang (oldest daughter).  Patient does  ? ?Patient Active Problem List  ? Diagnosis Date Noted  ? Bilateral lower extremity edema 02/09/2021  ? Psychophysiological insomnia 11/24/2020  ? Adjustment disorder with anxiety 11/24/2020  ? Shortness of breath 08/16/2020  ? Hypokalemia 08/16/2020  ? Obesity (BMI 30.0-34.9) 11/15/2019  ? Chronic frontal sinusitis 05/06/2019  ? Chronic allergic rhinitis 05/06/2019  ? Mixed hyperlipidemia 10/26/2018  ? Essential hypertension 09/25/2018  ? Benign paroxysmal positional vertigo due to bilateral vestibular disorder 09/25/2018  ? Tear of medial meniscus of knee 04/03/2017  ? ? ?Past Surgical History:  ?Procedure Laterality Date  ? KNEE ARTHROSCOPY WITH MEDIAL MENISECTOMY Left 04/16/2017  ? Procedure: KNEE ARTHROSCOPY WITH PARTIAL MEDIAL MENISECTOMY, CHONDROPLASTY, PARTIAL SYNOVECTOMY;  Surgeon: Lyndle Herrlich, MD;  Location: [facility] ORS;  Service: Orthopedics;  Laterality: Left;  ? SURGERY OF LIP  2008   ? TONSILLECTOMY  1974  ? TOOTH EXTRACTION  2008  ? vasectomy  1997  ? ? ?Family History  ?Problem Relation Age  of Onset  ? Breast cancer Mother   ? ? ?Social History  ? ?Socioeconomic History  ? Marital status: Single  ?  Spouse name: Not on file  ? Number of children: Not on file  ? Years of education: Not on file  ? Highest education level: Not on file  ?Occupational History  ? Not on file  ?Tobacco Use  ? Smoking status: Former  ?  Packs/day: 0.50  ?  Types: Cigarettes  ?  Quit date: 04/15/2016  ?  Years since quitting: 5.6  ? Smokeless tobacco: Former  ?Vaping Use  ? Vaping Use: Never used  ?Substance and Sexual Activity  ? Alcohol use: No  ? Drug use: No  ? Sexual activity: Not on file  ?Other Topics Concern  ? Not on file  ?Social History Narrative  ? Not on file  ? ?Social Determinants of Health  ? ?Financial Resource Strain: Not on file  ?Food Insecurity: Not on file  ?Transportation Needs: Not on file  ?Physical Activity: Not on file  ?Stress: Not on file  ?Social Connections: Not on file  ?Intimate Partner Violence: Not on file  ? ? ? ?Current Outpatient Medications:  ?  albuterol (VENTOLIN HFA) 108 (90 Base) MCG/ACT inhaler, Inhale 1-2 puffs into the lungs every 4 (four) hours as needed for wheezing or shortness of breath., Disp: 6.7 g, Rfl: 1 ?  cyclobenzaprine (FLEXERIL) 10 MG tablet, Take 1 tablet (10 mg total) by mouth 2 (two) times daily as needed for muscle spasms., Disp: 60 tablet, Rfl: 1 ?  fluticasone (FLONASE) 50 MCG/ACT nasal spray, Place 2 sprays into both nostrils daily., Disp: 9.9 mL, Rfl: 3 ?  hydrochlorothiazide (HYDRODIURIL) 25 MG tablet, Take 1 tablet (25 mg total) by mouth daily., Disp: 90 tablet, Rfl: 3 ?  hydrOXYzine (ATARAX) 50 MG tablet, TAKE 1 TABLET(50 MG) BY MOUTH THREE TIMES DAILY AS NEEDED FOR ANXIETY, Disp: 90 tablet, Rfl: 3 ?  montelukast (SINGULAIR) 10 MG tablet, TAKE 1 TABLET(10 MG) BY MOUTH AT BEDTIME, Disp: 90 tablet, Rfl: 3 ?  rosuvastatin (CRESTOR) 10 MG tablet, TAKE 1  TABLET(10 MG) BY MOUTH AT BEDTIME, Disp: 90 tablet, Rfl: 3 ?  sildenafil (REVATIO) 20 MG tablet, Take 1-5 pills about 30 min prior to sex. Start with 1 and increase as needed., Disp: 90 tablet, Rfl: 2

## 2022-05-03 ENCOUNTER — Ambulatory Visit (INDEPENDENT_AMBULATORY_CARE_PROVIDER_SITE_OTHER): Payer: BC Managed Care – PPO | Admitting: Family Medicine

## 2022-05-03 ENCOUNTER — Other Ambulatory Visit: Payer: Self-pay | Admitting: Family Medicine

## 2022-05-03 ENCOUNTER — Ambulatory Visit: Payer: BC Managed Care – PPO | Admitting: Family Medicine

## 2022-05-03 ENCOUNTER — Encounter: Payer: Self-pay | Admitting: Family Medicine

## 2022-05-03 VITALS — BP 118/80 | HR 60 | Ht 72.0 in | Wt 239.6 lb

## 2022-05-03 DIAGNOSIS — M545 Low back pain, unspecified: Secondary | ICD-10-CM

## 2022-05-03 DIAGNOSIS — R600 Localized edema: Secondary | ICD-10-CM | POA: Diagnosis not present

## 2022-05-03 DIAGNOSIS — R7309 Other abnormal glucose: Secondary | ICD-10-CM

## 2022-05-03 DIAGNOSIS — E782 Mixed hyperlipidemia: Secondary | ICD-10-CM

## 2022-05-03 DIAGNOSIS — E291 Testicular hypofunction: Secondary | ICD-10-CM

## 2022-05-03 DIAGNOSIS — J3089 Other allergic rhinitis: Secondary | ICD-10-CM

## 2022-05-03 DIAGNOSIS — I10 Essential (primary) hypertension: Secondary | ICD-10-CM

## 2022-05-03 DIAGNOSIS — F5104 Psychophysiologic insomnia: Secondary | ICD-10-CM

## 2022-05-03 DIAGNOSIS — F4322 Adjustment disorder with anxiety: Secondary | ICD-10-CM

## 2022-05-03 DIAGNOSIS — Z0000 Encounter for general adult medical examination without abnormal findings: Secondary | ICD-10-CM

## 2022-05-03 DIAGNOSIS — Z125 Encounter for screening for malignant neoplasm of prostate: Secondary | ICD-10-CM

## 2022-05-03 MED ORDER — HYDROCHLOROTHIAZIDE 25 MG PO TABS: 25.0000 mg | ORAL_TABLET | Freq: Every day | ORAL | 3 refills | Status: DC

## 2022-05-03 MED ORDER — HYDROXYZINE HCL 50 MG PO TABS: ORAL_TABLET | ORAL | 3 refills | Status: DC

## 2022-05-03 MED ORDER — ROSUVASTATIN CALCIUM 10 MG PO TABS: 10.0000 mg | ORAL_TABLET | Freq: Every day | ORAL | 3 refills | Status: DC

## 2022-05-03 MED ORDER — CYCLOBENZAPRINE HCL 10 MG PO TABS: 10.0000 mg | ORAL_TABLET | Freq: Two times a day (BID) | ORAL | 3 refills | Status: DC | PRN

## 2022-05-03 MED ORDER — TRAMADOL HCL 50 MG PO TABS: 50.0000 mg | ORAL_TABLET | Freq: Three times a day (TID) | ORAL | 0 refills | Status: DC | PRN

## 2022-05-03 MED ORDER — MONTELUKAST SODIUM 10 MG PO TABS: 10.0000 mg | ORAL_TABLET | Freq: Every day | ORAL | 3 refills | Status: DC

## 2022-05-03 NOTE — Progress Notes (Signed)
Subjective:    Patient ID: Timothy Lang, male    DOB: 04-26-1966, 56 y.o.   MRN: [id number]  Timothy Lang is a 56 y.o. male presenting on 05/03/2022 for Hypertension   HPI  CHRONIC HTN: Last visit 11/2021, was off Amlodipine due to swelling, and remains on HCTZ 108m it was changed last visit by EJunie PanningMecum PA from PM dosing waking up at night nocturia to AM dosing has done better. No more swelling  Home BP readings 127-136 / 76-80, has improved  Current Meds - HCTZ 251mdaily Reports good compliance, took meds today. Tolerating well, w/o complaints. - Exercise regularly with walking at work and hunting season, walking with hunting, then will return to gym off season Denies CP, dyspnea, HA, edema, dizziness / lightheadedness   Health Maintenance: Declines vaccines  Due for Cologuard, last was 01/06/19, now overdue, has kit but now cleared to take it and send back.     05/03/2022    8:53 AM 11/30/2021    8:52 AM 06/01/2021    8:54 AM  Depression screen PHQ 2/9  Decreased Interest 0 2 1  Down, Depressed, Hopeless 0 1 1  PHQ - 2 Score 0 3 2  Altered sleeping 1 3 1   Tired, decreased energy 1 2 2   Change in appetite 1 0 3  Feeling bad or failure about yourself  0 0 1  Trouble concentrating 0 0 1  Moving slowly or fidgety/restless 0 0 1  Suicidal thoughts 0 0 0  PHQ-9 Score 3 8 11   Difficult doing work/chores Not difficult at all Not difficult at all Somewhat difficult    Social History   Tobacco Use   Smoking status: Former    Packs/day: 0.50    Types: Cigarettes    Quit date: 04/15/2016    Years since quitting: 6.0   Smokeless tobacco: Former  VaScientific laboratory technicianse: Never used  Substance Use Topics   Alcohol use: No   Drug use: No    Review of Systems Per HPI unless specifically indicated above     Objective:    BP 118/80 (BP Location: Left Arm, Patient Position: Sitting, Cuff Size: Normal)   Pulse 60   Ht 6' (1.829 m)   Wt 239 lb 9.6 oz (108.7 kg)    SpO2 99%   BMI 32.50 kg/m   Wt Readings from Last 3 Encounters:  05/03/22 239 lb 9.6 oz (108.7 kg)  11/30/21 237 lb 9.6 oz (107.8 kg)  06/01/21 230 lb 6.4 oz (104.5 kg)    Physical Exam Vitals and nursing note reviewed.  Constitutional:      General: He is not in acute distress.    Appearance: He is well-developed. He is not diaphoretic.     Comments: Well-appearing, comfortable, cooperative  HENT:     Head: Normocephalic and atraumatic.  Eyes:     General:        Right eye: No discharge.        Left eye: No discharge.     Conjunctiva/sclera: Conjunctivae normal.  Neck:     Thyroid: No thyromegaly.  Cardiovascular:     Rate and Rhythm: Normal rate and regular rhythm.     Pulses: Normal pulses.     Heart sounds: Normal heart sounds. No murmur heard. Pulmonary:     Effort: Pulmonary effort is normal. No respiratory distress.     Breath sounds: Normal breath sounds. No wheezing or rales.  Musculoskeletal:  General: Normal range of motion.     Cervical back: Normal range of motion and neck supple.     Right lower leg: No edema.     Left lower leg: No edema.  Lymphadenopathy:     Cervical: No cervical adenopathy.  Skin:    General: Skin is warm and dry.     Findings: No erythema or rash.  Neurological:     Mental Status: He is alert and oriented to person, place, and time. Mental status is at baseline.  Psychiatric:        Behavior: Behavior normal.     Comments: Well groomed, good eye contact, normal speech and thoughts      Results for orders placed or performed in visit on 11/26/21  Testosterone  Result Value Ref Range   Testosterone 451 250 - 827 ng/dL  TSH  Result Value Ref Range   TSH 1.77 0.40 - 4.50 mIU/L  PSA  Result Value Ref Range   PSA 0.30 < OR = 4.00 ng/mL  Hemoglobin A1c  Result Value Ref Range   Hgb A1c MFr Bld 5.6 <5.7 % of total Hgb   Mean Plasma Glucose 114 mg/dL   eAG (mmol/L) 6.3 mmol/L  CBC with Differential/Platelet  Result  Value Ref Range   WBC 6.4 3.8 - 10.8 Thousand/uL   RBC 5.60 4.20 - 5.80 Million/uL   Hemoglobin 16.3 13.2 - 17.1 g/dL   HCT 48.3 38.5 - 50.0 %   MCV 86.3 80.0 - 100.0 fL   MCH 29.1 27.0 - 33.0 pg   MCHC 33.7 32.0 - 36.0 g/dL   RDW 13.1 11.0 - 15.0 %   Platelets 294 140 - 400 Thousand/uL   MPV 9.4 7.5 - 12.5 fL   Neutro Abs 3,002 1,500 - 7,800 cells/uL   Lymphs Abs 2,458 850 - 3,900 cells/uL   Absolute Monocytes 717 200 - 950 cells/uL   Eosinophils Absolute 147 15 - 500 cells/uL   Basophils Absolute 77 0 - 200 cells/uL   Neutrophils Relative % 46.9 %   Total Lymphocyte 38.4 %   Monocytes Relative 11.2 %   Eosinophils Relative 2.3 %   Basophils Relative 1.2 %  Lipid panel  Result Value Ref Range   Cholesterol 164 <200 mg/dL   HDL 47 > OR = 40 mg/dL   Triglycerides 201 (H) <150 mg/dL   LDL Cholesterol (Calc) 87 mg/dL (calc)   Total CHOL/HDL Ratio 3.5 <5.0 (calc)   Non-HDL Cholesterol (Calc) 117 <130 mg/dL (calc)  COMPLETE METABOLIC PANEL WITH GFR  Result Value Ref Range   Glucose, Bld 90 65 - 99 mg/dL   BUN 14 7 - 25 mg/dL   Creat 1.01 0.70 - 1.30 mg/dL   eGFR 87 > OR = 60 mL/min/1.2m   BUN/Creatinine Ratio NOT APPLICABLE 6 - 22 (calc)   Sodium 139 135 - 146 mmol/L   Potassium 4.1 3.5 - 5.3 mmol/L   Chloride 105 98 - 110 mmol/L   CO2 24 20 - 32 mmol/L   Calcium 9.3 8.6 - 10.3 mg/dL   Total Protein 6.9 6.1 - 8.1 g/dL   Albumin 4.5 3.6 - 5.1 g/dL   Globulin 2.4 1.9 - 3.7 g/dL (calc)   AG Ratio 1.9 1.0 - 2.5 (calc)   Total Bilirubin 0.5 0.2 - 1.2 mg/dL   Alkaline phosphatase (APISO) 123 35 - 144 U/L   AST 35 10 - 35 U/L   ALT 58 (H) 9 - 46 U/L  Assessment & Plan:   Problem List Items Addressed This Visit     Bilateral lower extremity edema   Relevant Medications   hydrochlorothiazide (HYDRODIURIL) 25 MG tablet   Essential hypertension - Primary   Relevant Medications   hydrochlorothiazide (HYDRODIURIL) 25 MG tablet   Other Visit Diagnoses     Acute  bilateral low back pain without sciatica           HTN Improved today on repeat BP is well controlled. Keep up on HCTZ 80m daily in MORNING Keep monitoring it  Edema has resolved, off Amlodipine  All meds refilled as requested.  Has Cologuard kit, advised to complete it now, he is eligible since after 12/2021  Meds ordered this encounter  Medications   hydrochlorothiazide (HYDRODIURIL) 25 MG tablet    Sig: Take 1 tablet (25 mg total) by mouth daily.    Dispense:  90 tablet    Refill:  3     Follow up plan: Return in about 7 months (around 12/02/2022) for 7 month fasting lab only then 1 week later Annual Physical.  Future labs ordered for 12/02/22  ANobie Putnam DMorton Grove[facility] 05/03/2022, 9:02 AM

## 2022-05-03 NOTE — Patient Instructions (Addendum)
Thank you for coming to the office today.  BP is well controlled. Keep up on HCTZ 25mg  daily in MORNING. BP improved on recheck today  Keep monitoring it  All meds refilled as requested.  Follow instructions to collect sample, you may call the company for any help or questions, 24/7 telephone support at [phone].  DUE for FASTING BLOOD WORK (no food or drink after midnight before the lab appointment, only water or coffee without cream/sugar on the morning of)  SCHEDULE "Lab Only" visit in the morning at the clinic for lab draw in 7 MONTHS   - Make sure Lab Only appointment is at about 1 week before your next appointment, so that results will be available  For Lab Results, once available within 2-3 days of blood draw, you can can log in to MyChart online to view your results and a brief explanation. Also, we can discuss results at next follow-up visit.    Please schedule a Follow-up Appointment to: Return in about 7 months (around 12/02/2022) for 7 month fasting lab only then 1 week later Annual Physical.  If you have any other questions or concerns, please feel free to call the office or send a message through [place]. You may also schedule an earlier appointment if necessary.  Additionally, you may be receiving a survey about your experience at our office within a few days to 1 week by e-mail or mail. We value your feedback.  Nobie Putnam, DO [place]

## 2022-07-31 ENCOUNTER — Other Ambulatory Visit: Payer: Self-pay | Admitting: Family Medicine

## 2022-07-31 DIAGNOSIS — R600 Localized edema: Secondary | ICD-10-CM

## 2022-07-31 DIAGNOSIS — I10 Essential (primary) hypertension: Secondary | ICD-10-CM

## 2022-07-31 NOTE — Telephone Encounter (Signed)
Future visit in 4  months .  Requested Prescriptions  Pending Prescriptions Disp Refills   hydrochlorothiazide (HYDRODIURIL) 25 MG tablet [Pharmacy Med Name: HYDROCHLOROTHIAZIDE 25MG  TABLETS] 90 tablet 2    Sig: TAKE 1 TABLET(25 MG) BY MOUTH DAILY     Cardiovascular: Diuretics - Thiazide Failed - 07/31/2022  7:09 AM      Failed - Cr in normal range and within 180 days    Creat  Date Value Ref Range Status  11/26/2021 1.01 0.70 - 1.30 mg/dL Final         Failed - K in normal range and within 180 days    Potassium  Date Value Ref Range Status  11/26/2021 4.1 3.5 - 5.3 mmol/L Final         Failed - Na in normal range and within 180 days    Sodium  Date Value Ref Range Status  11/26/2021 139 135 - 146 mmol/L Final         Passed - Last BP in normal range    BP Readings from Last 1 Encounters:  05/03/22 118/80         Passed - Valid encounter within last 6 months    Recent Outpatient Visits           2 months ago Essential hypertension   [facility], DO   8 months ago Annual physical exam   [facility] Mecum, [facility], Oswaldo Conroy   1 year ago Essential hypertension   [facility] [place], [place], DO   1 year ago Essential hypertension   [facility], DO   1 year ago Annual physical exam   [facility], DO       Future Appointments             In 4 months Smitty Cords, Althea Charon, DO [facility], [facility]

## 2022-08-06 ENCOUNTER — Other Ambulatory Visit: Payer: Self-pay | Admitting: Family Medicine

## 2022-08-06 DIAGNOSIS — I10 Essential (primary) hypertension: Secondary | ICD-10-CM

## 2022-08-06 DIAGNOSIS — M545 Low back pain, unspecified: Secondary | ICD-10-CM

## 2022-08-06 DIAGNOSIS — E782 Mixed hyperlipidemia: Secondary | ICD-10-CM

## 2022-08-06 DIAGNOSIS — R600 Localized edema: Secondary | ICD-10-CM

## 2022-08-06 NOTE — Telephone Encounter (Unsigned)
Copied from CRM [id number]. Topic: General - Other >> Aug 06, 2022  3:28 PM Everette C wrote: Reason for CRM: Medication Refill - Medication: traMADol (ULTRAM) 50 MG tablet [[id number]]  cyclobenzaprine (FLEXERIL) 10 MG tablet [[id number]]  rosuvastatin (CRESTOR) 10 MG tablet [[id number]]  hydrochlorothiazide (HYDRODIURIL) 25 MG tablet [[id number]]  Has the patient contacted their pharmacy? Yes.   (Agent: If no, request that the patient contact the pharmacy for the refill. If patient does not wish to contact the pharmacy document the reason why and proceed with request.) (Agent: If yes, when and what did the pharmacy advise?)  Preferred Pharmacy (with phone number or street name): [id number] [place], [place] - [address] AT [facility] [address] [place] [address] Phone: [phone] Fax: [phone] Hours: Not open 24 hours   Has the patient been seen for an appointment in the last year OR does the patient have an upcoming appointment? Yes.    Agent: Please be advised that RX refills may take up to 3 business days. We ask that you follow-up with your pharmacy.

## 2022-08-08 MED ORDER — CYCLOBENZAPRINE HCL 10 MG PO TABS: 10.0000 mg | ORAL_TABLET | Freq: Two times a day (BID) | ORAL | 3 refills | Status: DC | PRN

## 2022-08-08 MED ORDER — TRAMADOL HCL 50 MG PO TABS: 50.0000 mg | ORAL_TABLET | Freq: Three times a day (TID) | ORAL | 0 refills | Status: DC | PRN

## 2022-08-08 NOTE — Telephone Encounter (Signed)
Requested medication (s) are due for refill today: yes and no  Requested medication (s) are on the active medication list: yes  Last refill:  flexeril 05/03/22 #90/3, tramadol 05/02/22 #21/0  Future visit scheduled: no  Notes to clinic:  Unable to refill per protocol, cannot delegate.      Requested Prescriptions  Pending Prescriptions Disp Refills   cyclobenzaprine (FLEXERIL) 10 MG tablet 90 tablet 3    Sig: Take 1 tablet (10 mg total) by mouth 2 (two) times daily as needed for muscle spasms.     Not Delegated - Analgesics:  Muscle Relaxants Failed - 08/06/2022  5:07 PM      Failed - This refill cannot be delegated      Passed - Valid encounter within last 6 months    Recent Outpatient Visits           3 months ago Essential hypertension   [facility] [place], Netta Neat, DO   8 months ago Annual physical exam   [facility] Mecum, Oswaldo Conroy, [place]   1 year ago Essential hypertension   [facility] Smitty Cords, DO   1 year ago Essential hypertension   [facility] Smitty Cords, DO   1 year ago Annual physical exam   [facility] Smitty Cords, DO       Future Appointments             In 4 months Althea Charon, Netta Neat, DO [facility]             traMADol (ULTRAM) 50 MG tablet 21 tablet 0    Sig: Take 1 tablet (50 mg total) by mouth every 8 (eight) hours as needed.     Not Delegated - Analgesics:  Opioid Agonists Failed - 08/06/2022  5:07 PM      Failed - This refill cannot be delegated      Failed - Urine Drug Screen completed in last 360 days      Failed - Valid encounter within last 3 months    Recent Outpatient Visits           3 months ago Essential hypertension   [facility] [place], Netta Neat, DO   8 months ago Annual physical exam   [facility] Mecum, Oswaldo Conroy, [place]   1 year  ago Essential hypertension   [facility] Smitty Cords, DO   1 year ago Essential hypertension   [facility] Smitty Cords, DO   1 year ago Annual physical exam   [facility] Smitty Cords, DO       Future Appointments             In 4 months Althea Charon, Netta Neat, DO [facility]            Refused Prescriptions Disp Refills   hydrochlorothiazide (HYDRODIURIL) 25 MG tablet 90 tablet 2    Sig: TAKE 1 TABLET(25 MG) BY MOUTH DAILY     Cardiovascular: Diuretics - Thiazide Failed - 08/06/2022  5:07 PM      Failed - Cr in normal range and within 180 days    Creat  Date Value Ref Range Status  11/26/2021 1.01 0.70 - 1.30 mg/dL Final         Failed - K in normal range and within 180 days  Potassium  Date Value Ref Range Status  11/26/2021 4.1 3.5 - 5.3 mmol/L Final         Failed - Na in normal range and within 180 days    Sodium  Date Value Ref Range Status  11/26/2021 139 135 - 146 mmol/L Final         Passed - Last BP in normal range    BP Readings from Last 1 Encounters:  05/03/22 118/80         Passed - Valid encounter within last 6 months    Recent Outpatient Visits           3 months ago Essential hypertension   [facility] [place], Netta Neat, DO   8 months ago Annual physical exam   [facility] Mecum, Oswaldo Conroy, [place]   1 year ago Essential hypertension   [facility] Smitty Cords, DO   1 year ago Essential hypertension   [facility] Althea Charon, Netta Neat, DO   1 year ago Annual physical exam   [facility] Smitty Cords, DO       Future Appointments             In 4 months Althea Charon, Netta Neat, DO [facility]             rosuvastatin (CRESTOR) 10 MG tablet 90 tablet 3    Sig: Take 1 tablet (10 mg total) by  mouth daily.     Cardiovascular:  Antilipid - Statins 2 Failed - 08/06/2022  5:07 PM      Failed - Lipid Panel in normal range within the last 12 months    Cholesterol  Date Value Ref Range Status  11/26/2021 164 <200 mg/dL Final   LDL Cholesterol (Calc)  Date Value Ref Range Status  11/26/2021 87 mg/dL (calc) Final    Comment:    Reference range: <100 . Desirable range <100 mg/dL for primary prevention;   <70 mg/dL for patients with CHD or diabetic patients  with > or = 2 CHD risk factors. Marland Kitchen LDL-C is now calculated using the Martin-Hopkins  calculation, which is a validated novel method providing  better accuracy than the Friedewald equation in the  estimation of LDL-C.  Horald Pollen et al. Lenox Ahr. 2725;366(44): 2061-2068  (http://education.[employer].com/faq/FAQ164)    HDL  Date Value Ref Range Status  11/26/2021 47 > OR = 40 mg/dL Final   Triglycerides  Date Value Ref Range Status  11/26/2021 201 (H) <150 mg/dL Final    Comment:    . If a non-fasting specimen was collected, consider repeat triglyceride testing on a fasting specimen if clinically indicated.  Perry Mount et al. J. of Clin. Lipidol. 2015;9:129-169. Marland Kitchen          Passed - Cr in normal range and within 360 days    Creat  Date Value Ref Range Status  11/26/2021 1.01 0.70 - 1.30 mg/dL Final         Passed - Patient is not pregnant      Passed - Valid encounter within last 12 months    Recent Outpatient Visits           3 months ago Essential hypertension   [facility] [place], Netta Neat, DO   8 months ago Annual physical exam   [facility] Mecum, Oswaldo Conroy, PA-C   1 year ago Essential hypertension   [facility] Smitty Cords, DO   1 year ago Essential hypertension   [facility] Althea Charon, Netta Neat, DO   1 year ago Annual physical exam   [facility] Althea Charon Netta Neat, DO       Future  Appointments             In 4 months Althea Charon, Netta Neat, DO [facility], [facility]

## 2022-08-08 NOTE — Telephone Encounter (Signed)
Hctz was sent to pharmacy on 07/31/22 #90/2. Crestor was sent on 05/03/22 #90/3.   Requested Prescriptions  Pending Prescriptions Disp Refills   cyclobenzaprine (FLEXERIL) 10 MG tablet 90 tablet 3    Sig: Take 1 tablet (10 mg total) by mouth 2 (two) times daily as needed for muscle spasms.     Not Delegated - Analgesics:  Muscle Relaxants Failed - 08/06/2022  5:07 PM      Failed - This refill cannot be delegated      Passed - Valid encounter within last 6 months    Recent Outpatient Visits           3 months ago Essential hypertension   [facility] [place], Netta Neat, DO   8 months ago Annual physical exam   [facility] Mecum, Oswaldo Conroy, [place]   1 year ago Essential hypertension   [facility] Smitty Cords, DO   1 year ago Essential hypertension   [facility] Smitty Cords, DO   1 year ago Annual physical exam   [facility] Smitty Cords, DO       Future Appointments             In 4 months Althea Charon, Netta Neat, DO [facility]             traMADol (ULTRAM) 50 MG tablet 21 tablet 0    Sig: Take 1 tablet (50 mg total) by mouth every 8 (eight) hours as needed.     Not Delegated - Analgesics:  Opioid Agonists Failed - 08/06/2022  5:07 PM      Failed - This refill cannot be delegated      Failed - Urine Drug Screen completed in last 360 days      Failed - Valid encounter within last 3 months    Recent Outpatient Visits           3 months ago Essential hypertension   [facility] [place], Netta Neat, DO   8 months ago Annual physical exam   [facility] Mecum, Oswaldo Conroy, [place]   1 year ago Essential hypertension   [facility] [place], Netta Neat, DO   1 year ago Essential hypertension   [facility] [place], Netta Neat, DO   1 year ago Annual physical exam    [facility] Smitty Cords, DO       Future Appointments             In 4 months Althea Charon, Netta Neat, DO [facility]            Refused Prescriptions Disp Refills   hydrochlorothiazide (HYDRODIURIL) 25 MG tablet 90 tablet 2    Sig: TAKE 1 TABLET(25 MG) BY MOUTH DAILY     Cardiovascular: Diuretics - Thiazide Failed - 08/06/2022  5:07 PM      Failed - Cr in normal range and within 180 days    Creat  Date Value Ref Range Status  11/26/2021 1.01 0.70 - 1.30 mg/dL Final         Failed - K in normal range and within 180 days    Potassium  Date Value Ref Range Status  11/26/2021 4.1 3.5 - 5.3 mmol/L Final         Failed - Na in normal range and within 180 days    Sodium  Date Value Ref Range Status  11/26/2021 139 135 - 146 mmol/L Final         Passed - Last BP in normal range    BP Readings from Last 1 Encounters:  05/03/22 118/80         Passed - Valid encounter within last 6 months    Recent Outpatient Visits           3 months ago Essential hypertension   [facility] [place], Netta Neat, DO   8 months ago Annual physical exam   [facility] Mecum, Oswaldo Conroy, [place]   1 year ago Essential hypertension   [facility] Smitty Cords, DO   1 year ago Essential hypertension   [facility] Smitty Cords, DO   1 year ago Annual physical exam   [facility] Smitty Cords, DO       Future Appointments             In 4 months Althea Charon, Netta Neat, DO [facility]             rosuvastatin (CRESTOR) 10 MG tablet 90 tablet 3    Sig: Take 1 tablet (10 mg total) by mouth daily.     Cardiovascular:  Antilipid - Statins 2 Failed - 08/06/2022  5:07 PM      Failed - Lipid Panel in normal range within the last 12 months    Cholesterol  Date Value Ref Range Status  11/26/2021 164  <200 mg/dL Final   LDL Cholesterol (Calc)  Date Value Ref Range Status  11/26/2021 87 mg/dL (calc) Final    Comment:    Reference range: <100 . Desirable range <100 mg/dL for primary prevention;   <70 mg/dL for patients with CHD or diabetic patients  with > or = 2 CHD risk factors. Marland Kitchen LDL-C is now calculated using the Martin-Hopkins  calculation, which is a validated novel method providing  better accuracy than the Friedewald equation in the  estimation of LDL-C.  Horald Pollen et al. Lenox Ahr. 6384;536(46): 2061-2068  (http://education.[facility].com/faq/FAQ164)    HDL  Date Value Ref Range Status  11/26/2021 47 > OR = 40 mg/dL Final   Triglycerides  Date Value Ref Range Status  11/26/2021 201 (H) <150 mg/dL Final    Comment:    . If a non-fasting specimen was collected, consider repeat triglyceride testing on a fasting specimen if clinically indicated.  Perry Mount et al. J. of Clin. Lipidol. 2015;9:129-169. Marland Kitchen          Passed - Cr in normal range and within 360 days    Creat  Date Value Ref Range Status  11/26/2021 1.01 0.70 - 1.30 mg/dL Final         Passed - Patient is not pregnant      Passed - Valid encounter within last 12 months    Recent Outpatient Visits           3 months ago Essential hypertension   [facility] [place], Netta Neat, DO   8 months ago Annual physical exam   [facility] Mecum, Oswaldo Conroy, [place]   1 year ago Essential hypertension   [facility] Smitty Cords, DO   1 year ago Essential hypertension   [facility] Smitty Cords, DO   1 year ago Annual physical exam   [facility] Saralyn Pilar  J, DO       Future Appointments             In 4 months [place], Netta Neat, DO [facility]

## 2022-08-12 DIAGNOSIS — Z1211 Encounter for screening for malignant neoplasm of colon: Secondary | ICD-10-CM | POA: Diagnosis not present

## 2022-08-17 LAB — COLOGUARD: COLOGUARD: NEGATIVE

## 2022-10-22 ENCOUNTER — Other Ambulatory Visit: Payer: Self-pay | Admitting: Family Medicine

## 2022-10-22 DIAGNOSIS — M545 Low back pain, unspecified: Secondary | ICD-10-CM

## 2022-10-23 NOTE — Telephone Encounter (Signed)
Requested medication (s) are due for refill today:   Provider to review  Requested medication (s) are on the active medication list:   Yes  Future visit scheduled:   Yes   Last ordered: 08/08/2022 #21, 0 refills  Non delegated refill   Requested Prescriptions  Pending Prescriptions Disp Refills   traMADol (ULTRAM) 50 MG tablet [Pharmacy Med Name: TRAMADOL '50MG'$  TABLETS] 21 tablet     Sig: TAKE 1 TABLET(50 MG) BY MOUTH EVERY 8 HOURS AS NEEDED     Not Delegated - Analgesics:  Opioid Agonists Failed - 10/22/2022  1:30 PM      Failed - This refill cannot be delegated      Failed - Urine Drug Screen completed in last 360 days      Failed - Valid encounter within last 3 months    Recent Outpatient Visits           5 months ago Essential hypertension   [place], DO   10 months ago Annual physical exam   [facility] Mecum, Dani [place], [place]   1 year ago Essential hypertension   [place], [place], DO   1 year ago Essential hypertension   [place], DO   1 year ago Annual physical exam   [facility] Olin Hauser, DO       Future Appointments             In 1 month [place], Devonne Doughty, DO [facility], [facility]

## 2022-11-14 DIAGNOSIS — L739 Follicular disorder, unspecified: Secondary | ICD-10-CM | POA: Diagnosis not present

## 2022-12-02 ENCOUNTER — Other Ambulatory Visit: Payer: BC Managed Care – PPO

## 2022-12-02 DIAGNOSIS — E782 Mixed hyperlipidemia: Secondary | ICD-10-CM | POA: Diagnosis not present

## 2022-12-02 DIAGNOSIS — E291 Testicular hypofunction: Secondary | ICD-10-CM

## 2022-12-02 DIAGNOSIS — I10 Essential (primary) hypertension: Secondary | ICD-10-CM

## 2022-12-02 DIAGNOSIS — Z125 Encounter for screening for malignant neoplasm of prostate: Secondary | ICD-10-CM

## 2022-12-02 DIAGNOSIS — Z0000 Encounter for general adult medical examination without abnormal findings: Secondary | ICD-10-CM

## 2022-12-02 DIAGNOSIS — R7309 Other abnormal glucose: Secondary | ICD-10-CM | POA: Diagnosis not present

## 2022-12-03 LAB — CBC WITH DIFFERENTIAL/PLATELET
Absolute Monocytes: 715 cells/uL (ref 200–950)
Basophils Absolute: 72 cells/uL (ref 0–200)
Basophils Relative: 1.1 %
Eosinophils Absolute: 182 cells/uL (ref 15–500)
Eosinophils Relative: 2.8 %
HCT: 49.6 % (ref 38.5–50.0)
Hemoglobin: 16.3 g/dL (ref 13.2–17.1)
Lymphs Abs: 2321 cells/uL (ref 850–3900)
MCH: 28.7 pg (ref 27.0–33.0)
MCHC: 32.9 g/dL (ref 32.0–36.0)
MCV: 87.3 fL (ref 80.0–100.0)
MPV: 9.7 fL (ref 7.5–12.5)
Monocytes Relative: 11 %
Neutro Abs: 3211 cells/uL (ref 1500–7800)
Neutrophils Relative %: 49.4 %
Platelets: 290 10*3/uL (ref 140–400)
RBC: 5.68 10*6/uL (ref 4.20–5.80)
RDW: 13.5 % (ref 11.0–15.0)
Total Lymphocyte: 35.7 %
WBC: 6.5 10*3/uL (ref 3.8–10.8)

## 2022-12-03 LAB — TESTOSTERONE: Testosterone: 460 ng/dL (ref 250–827)

## 2022-12-03 LAB — PSA: PSA: 0.42 ng/mL (ref <=4.00)

## 2022-12-03 LAB — HEMOGLOBIN A1C
Hgb A1c MFr Bld: 5.6 % of total Hgb (ref <5.7)
Mean Plasma Glucose: 114 mg/dL
eAG (mmol/L): 6.3 mmol/L

## 2022-12-03 LAB — COMPLETE METABOLIC PANEL WITH GFR
AG Ratio: 2 (calc) (ref 1.0–2.5)
ALT: 41 U/L (ref 9–46)
AST: 35 U/L (ref 10–35)
Albumin: 4.7 g/dL (ref 3.6–5.1)
Alkaline phosphatase (APISO): 116 U/L (ref 35–144)
BUN: 14 mg/dL (ref 7–25)
CO2: 29 mmol/L (ref 20–32)
Calcium: 9.7 mg/dL (ref 8.6–10.3)
Chloride: 102 mmol/L (ref 98–110)
Creat: 0.98 mg/dL (ref 0.70–1.30)
Globulin: 2.4 g/dL (calc) (ref 1.9–3.7)
Glucose, Bld: 73 mg/dL (ref 65–99)
Potassium: 4.1 mmol/L (ref 3.5–5.3)
Sodium: 139 mmol/L (ref 135–146)
Total Bilirubin: 0.6 mg/dL (ref 0.2–1.2)
Total Protein: 7.1 g/dL (ref 6.1–8.1)
eGFR: 90 mL/min/{1.73_m2} (ref >=60)

## 2022-12-03 LAB — LIPID PANEL
Cholesterol: 149 mg/dL (ref <200)
HDL: 55 mg/dL (ref >=40)
LDL Cholesterol (Calc): 77 mg/dL (calc)
Non-HDL Cholesterol (Calc): 94 mg/dL (calc) (ref <130)
Total CHOL/HDL Ratio: 2.7 (calc) (ref <5.0)
Triglycerides: 86 mg/dL (ref <150)

## 2022-12-03 LAB — TSH: TSH: 1.36 mIU/L (ref 0.40–4.50)

## 2022-12-06 ENCOUNTER — Other Ambulatory Visit: Payer: Self-pay | Admitting: Family Medicine

## 2022-12-06 ENCOUNTER — Ambulatory Visit (INDEPENDENT_AMBULATORY_CARE_PROVIDER_SITE_OTHER): Payer: BC Managed Care – PPO | Admitting: Family Medicine

## 2022-12-06 ENCOUNTER — Ambulatory Visit (INDEPENDENT_AMBULATORY_CARE_PROVIDER_SITE_OTHER): Payer: BC Managed Care – PPO | Admitting: Podiatry

## 2022-12-06 ENCOUNTER — Encounter: Payer: Self-pay | Admitting: Family Medicine

## 2022-12-06 VITALS — BP 128/68 | HR 78 | Ht 72.0 in | Wt 230.0 lb

## 2022-12-06 DIAGNOSIS — R7309 Other abnormal glucose: Secondary | ICD-10-CM

## 2022-12-06 DIAGNOSIS — E669 Obesity, unspecified: Secondary | ICD-10-CM

## 2022-12-06 DIAGNOSIS — Z0000 Encounter for general adult medical examination without abnormal findings: Secondary | ICD-10-CM

## 2022-12-06 DIAGNOSIS — L600 Ingrowing nail: Secondary | ICD-10-CM | POA: Diagnosis not present

## 2022-12-06 DIAGNOSIS — E291 Testicular hypofunction: Secondary | ICD-10-CM

## 2022-12-06 DIAGNOSIS — I10 Essential (primary) hypertension: Secondary | ICD-10-CM | POA: Diagnosis not present

## 2022-12-06 DIAGNOSIS — N529 Male erectile dysfunction, unspecified: Secondary | ICD-10-CM

## 2022-12-06 DIAGNOSIS — Z125 Encounter for screening for malignant neoplasm of prostate: Secondary | ICD-10-CM

## 2022-12-06 DIAGNOSIS — E782 Mixed hyperlipidemia: Secondary | ICD-10-CM

## 2022-12-06 DIAGNOSIS — F5104 Psychophysiologic insomnia: Secondary | ICD-10-CM

## 2022-12-06 MED ORDER — TADALAFIL 20 MG PO TABS
20.0000 mg | ORAL_TABLET | ORAL | 5 refills | Status: AC | PRN
Start: 2022-12-06 — End: ?

## 2022-12-06 NOTE — Progress Notes (Signed)
Subjective:    Patient ID: Timothy Lang, male    DOB: [birth year], 57 y.o.   MRN: [id number]  Timothy Lang is a 57 y.o. male presenting on 12/06/2022 for Annual Exam (No complaints or concerns)   HPI  Here for Annual Physical and Lab Review  CHRONIC HTN: Doing well on current med. Current Meds - HCTZ 25mg  daily Reports good compliance, took meds today. Tolerating well, w/o complaints. - Exercise regularly with walking at work and hunting season, walking with hunting, then will return to gym off season Denies CP, dyspnea, HA, edema, dizziness / lightheadedness  HYPERLIPIDEMIA: - Reports no concerns. Last lipid panel 11/2022, controlled on statin - Currently taking Rosuvastatin 10mg , tolerating well without side effects or myalgias  Elevated A1c A1c 5.6, stable from previous  Erectile Dysfunction Tried Sildenafil 20mg  x 4 = 80mg  AS NEEDED without success Wants to try Cialis generic as an option.   Has upcoming Podiatry apt R 5th toe with toenail injury.    Health Maintenance:  Cologuard completed 08/12/22 - negative, repeat 3 years 2027  Future Shingles vaccine.  PSA 0.42 (negative, 11/2022)     12/06/2022    8:12 AM 12/06/2022    8:08 AM 05/03/2022    8:53 AM  Depression screen PHQ 2/9  Decreased Interest 1 0 0  Down, Depressed, Hopeless 1 0 0  PHQ - 2 Score 2 0 0  Altered sleeping 3  1  Tired, decreased energy 2  1  Change in appetite 2  1  Feeling bad or failure about yourself  0  0  Trouble concentrating 0  0  Moving slowly or fidgety/restless 0  0  Suicidal thoughts 1  0  PHQ-9 Score 10  3  Difficult doing work/chores   Not difficult at all    Past Medical History:  Diagnosis Date   Allergic rhinitis    HLD (hyperlipidemia)    HTN (hypertension)    Past Surgical History:  Procedure Laterality Date   KNEE ARTHROSCOPY WITH MEDIAL MENISECTOMY Left 04/16/2017   Procedure: KNEE ARTHROSCOPY WITH PARTIAL MEDIAL MENISECTOMY, CHONDROPLASTY, PARTIAL  SYNOVECTOMY;  Surgeon: Lyndle Herrlich, MD;  Location: [facility] ORS;  Service: Orthopedics;  Laterality: Left;   SURGERY OF LIP  2008   TONSILLECTOMY  1974   TOOTH EXTRACTION  2008   vasectomy  1997   Social History   Socioeconomic History   Marital status: Single    Spouse name: Not on file   Number of children: Not on file   Years of education: Not on file   Highest education level: Not on file  Occupational History   Not on file  Tobacco Use   Smoking status: Former    Packs/day: .5    Types: Cigarettes    Quit date: 04/15/2016    Years since quitting: 6.6   Smokeless tobacco: Former  Building services engineer Use: Never used  Substance and Sexual Activity   Alcohol use: No   Drug use: No   Sexual activity: Not on file  Other Topics Concern   Not on file  Social History Narrative   Not on file   Social Determinants of Health   Financial Resource Strain: Not on file  Food Insecurity: Not on file  Transportation Needs: Not on file  Physical Activity: Not on file  Stress: Not on file  Social Connections: Not on file  Intimate Partner Violence: Not on file   Family History  Problem Relation Age  of Onset   Breast cancer Mother    Current Outpatient Medications on File Prior to Visit  Medication Sig   albuterol (VENTOLIN HFA) 108 (90 Base) MCG/ACT inhaler Inhale 1-2 puffs into the lungs every 4 (four) hours as needed for wheezing or shortness of breath.   cyclobenzaprine (FLEXERIL) 10 MG tablet Take 1 tablet (10 mg total) by mouth 2 (two) times daily as needed for muscle spasms.   fluticasone (FLONASE) 50 MCG/ACT nasal spray Place 2 sprays into both nostrils daily.   hydrochlorothiazide (HYDRODIURIL) 25 MG tablet TAKE 1 TABLET(25 MG) BY MOUTH DAILY   hydrOXYzine (ATARAX) 50 MG tablet TAKE 1 TABLET(50 MG) BY MOUTH THREE TIMES DAILY AS NEEDED FOR ANXIETY   montelukast (SINGULAIR) 10 MG tablet Take 1 tablet (10 mg total) by mouth at bedtime.   rosuvastatin (CRESTOR) 10 MG tablet  Take 1 tablet (10 mg total) by mouth daily.   traMADol (ULTRAM) 50 MG tablet Take 1 tablet (50 mg total) by mouth every 8 (eight) hours as needed for moderate pain.   No current facility-administered medications on file prior to visit.    Review of Systems  Constitutional:  Negative for activity change, appetite change, chills, diaphoresis, fatigue and fever.  HENT:  Negative for congestion and hearing loss.   Eyes:  Negative for visual disturbance.  Respiratory:  Negative for cough, chest tightness, shortness of breath and wheezing.   Cardiovascular:  Negative for chest pain, palpitations and leg swelling.  Gastrointestinal:  Negative for abdominal pain, constipation, diarrhea, nausea and vomiting.  Genitourinary:  Negative for dysuria, frequency and hematuria.  Musculoskeletal:  Negative for arthralgias and neck pain.  Skin:  Negative for rash.  Neurological:  Negative for dizziness, weakness, light-headedness, numbness and headaches.  Hematological:  Negative for adenopathy.  Psychiatric/Behavioral:  Negative for behavioral problems, dysphoric mood and sleep disturbance.    Per HPI unless specifically indicated above     Objective:    BP 128/68   Pulse 78   Ht 6' (1.829 m)   Wt 230 lb (104.3 kg)   SpO2 98%   BMI 31.19 kg/m   Wt Readings from Last 3 Encounters:  12/06/22 230 lb (104.3 kg)  05/03/22 239 lb 9.6 oz (108.7 kg)  11/30/21 237 lb 9.6 oz (107.8 kg)    Physical Exam Vitals and nursing note reviewed.  Constitutional:      General: He is not in acute distress.    Appearance: He is well-developed. He is not diaphoretic.     Comments: Well-appearing, comfortable, cooperative  HENT:     Head: Normocephalic and atraumatic.  Eyes:     General:        Right eye: No discharge.        Left eye: No discharge.     Conjunctiva/sclera: Conjunctivae normal.     Pupils: Pupils are equal, round, and reactive to light.  Neck:     Thyroid: No thyromegaly.     Vascular: No  carotid bruit.  Cardiovascular:     Rate and Rhythm: Normal rate and regular rhythm.     Pulses: Normal pulses.     Heart sounds: Normal heart sounds. No murmur heard. Pulmonary:     Effort: Pulmonary effort is normal. No respiratory distress.     Breath sounds: Normal breath sounds. No wheezing or rales.  Abdominal:     General: Bowel sounds are normal. There is no distension.     Palpations: Abdomen is soft. There is no mass.  Tenderness: There is no abdominal tenderness.  Musculoskeletal:        General: No tenderness. Normal range of motion.     Cervical back: Normal range of motion and neck supple.     Right lower leg: No edema.     Left lower leg: No edema.     Comments: Upper / Lower Extremities: - Normal muscle tone, strength bilateral upper extremities 5/5, lower extremities 5/5  Lymphadenopathy:     Cervical: No cervical adenopathy.  Skin:    General: Skin is warm and dry.     Findings: No erythema or rash.  Neurological:     Mental Status: He is alert and oriented to person, place, and time.     Comments: Distal sensation intact to light touch all extremities  Psychiatric:        Mood and Affect: Mood normal.        Behavior: Behavior normal.        Thought Content: Thought content normal.     Comments: Well groomed, good eye contact, normal speech and thoughts      Results for orders placed or performed in visit on 12/02/22  Testosterone  Result Value Ref Range   Testosterone 460 250 - 827 ng/dL  TSH  Result Value Ref Range   TSH 1.36 0.40 - 4.50 mIU/L  PSA  Result Value Ref Range   PSA 0.42 < OR = 4.00 ng/mL  Hemoglobin A1c  Result Value Ref Range   Hgb A1c MFr Bld 5.6 <5.7 % of total Hgb   Mean Plasma Glucose 114 mg/dL   eAG (mmol/L) 6.3 mmol/L  Lipid panel  Result Value Ref Range   Cholesterol 149 <200 mg/dL   HDL 55 > OR = 40 mg/dL   Triglycerides 86 <161 mg/dL   LDL Cholesterol (Calc) 77 mg/dL (calc)   Total CHOL/HDL Ratio 2.7 <5.0 (calc)    Non-HDL Cholesterol (Calc) 94 <096 mg/dL (calc)  CBC with Differential/Platelet  Result Value Ref Range   WBC 6.5 3.8 - 10.8 Thousand/uL   RBC 5.68 4.20 - 5.80 Million/uL   Hemoglobin 16.3 13.2 - 17.1 g/dL   HCT 04.5 40.9 - 81.1 %   MCV 87.3 80.0 - 100.0 fL   MCH 28.7 27.0 - 33.0 pg   MCHC 32.9 32.0 - 36.0 g/dL   RDW 91.4 78.2 - 95.6 %   Platelets 290 140 - 400 Thousand/uL   MPV 9.7 7.5 - 12.5 fL   Neutro Abs 3,211 1,500 - 7,800 cells/uL   Lymphs Abs 2,321 850 - 3,900 cells/uL   Absolute Monocytes 715 200 - 950 cells/uL   Eosinophils Absolute 182 15 - 500 cells/uL   Basophils Absolute 72 0 - 200 cells/uL   Neutrophils Relative % 49.4 %   Total Lymphocyte 35.7 %   Monocytes Relative 11.0 %   Eosinophils Relative 2.8 %   Basophils Relative 1.1 %  COMPLETE METABOLIC PANEL WITH GFR  Result Value Ref Range   Glucose, Bld 73 65 - 99 mg/dL   BUN 14 7 - 25 mg/dL   Creat 2.13 0.86 - 5.78 mg/dL   eGFR 90 > OR = 60 IO/NGE/9.52W4   BUN/Creatinine Ratio SEE NOTE: 6 - 22 (calc)   Sodium 139 135 - 146 mmol/L   Potassium 4.1 3.5 - 5.3 mmol/L   Chloride 102 98 - 110 mmol/L   CO2 29 20 - 32 mmol/L   Calcium 9.7 8.6 - 10.3 mg/dL   Total Protein 7.1 6.1 -  8.1 g/dL   Albumin 4.7 3.6 - 5.1 g/dL   Globulin 2.4 1.9 - 3.7 g/dL (calc)   AG Ratio 2.0 1.0 - 2.5 (calc)   Total Bilirubin 0.6 0.2 - 1.2 mg/dL   Alkaline phosphatase (APISO) 116 35 - 144 U/L   AST 35 10 - 35 U/L   ALT 41 9 - 46 U/L      Assessment & Plan:   Problem List Items Addressed This Visit     Essential hypertension    Well-controlled HTN No known complications  Off Amlodipine due to swelling   Plan:  1. Continue current BP regimen - HCTZ 25mg  daily 2. Encourage improved lifestyle - low sodium diet, regular exercise 3. Continue monitor BP outside office, bring readings to next visit, if persistently >140/90 or new symptoms notify office sooner      Relevant Medications   tadalafil (CIALIS) 20 MG tablet   Mixed  hyperlipidemia    Controlled cholesterol on statin and lifestyle Last lipid panel 11/2022  Plan: 1. Continue current meds - Rosuvastatin 10mg  nightly 2. Encourage improved lifestyle - low carb/cholesterol, reduce portion size, continue improving regular exercise      Relevant Medications   tadalafil (CIALIS) 20 MG tablet   Obesity (BMI 30.0-34.9)   Psychophysiological insomnia    Clinically with secondary anxiety, adjustment disorde Hydroxyzine PRN      Other Visit Diagnoses     Annual physical exam    -  Primary   Hypogonadism in male       Erectile dysfunction, unspecified erectile dysfunction type       Relevant Medications   tadalafil (CIALIS) 20 MG tablet       Updated Health Maintenance information Reviewed recent lab results with patient Encouraged improvement to lifestyle with diet and exercise Goal of weight loss  Testosterone normal range.  ED Failed Sildenafil Trial on Tadalafil Cialis rx goodrx   Meds ordered this encounter  Medications   tadalafil (CIALIS) 20 MG tablet    Sig: Take 1 tablet (20 mg total) by mouth every other day as needed for erectile dysfunction.    Dispense:  30 tablet    Refill:  5      Follow up plan: Return in about 1 year (around 12/06/2023) for 1 year fasting lab only then 1 week later Annual Physical.  Future orders 12/06/23  Saralyn Pilar, DO [facility] 12/06/2022, 8:13 AM

## 2022-12-06 NOTE — Patient Instructions (Addendum)
Thank you for coming to the office today.  Switch from generic Viagra to generic Cialis, works for 36 hours, take every 48 hour if needed.   DUE for FASTING BLOOD WORK (no food or drink after midnight before the lab appointment, only water or coffee without cream/sugar on the morning of)  SCHEDULE "Lab Only" visit in the morning at the clinic for lab draw in 1 YEAR  - Make sure Lab Only appointment is at about 1 week before your next appointment, so that results will be available  For Lab Results, once available within 2-3 days of blood draw, you can can log in to MyChart online to view your results and a brief explanation. Also, we can discuss results at next follow-up visit.    Please schedule a Follow-up Appointment to: Return in about 1 year (around 12/06/2023) for 1 year fasting lab only then 1 week later Annual Physical.  If you have any other questions or concerns, please feel free to call the office or send a message through MyChart. You may also schedule an earlier appointment if necessary.  Additionally, you may be receiving a survey about your experience at our office within a few days to 1 week by e-mail or mail. We value your feedback.  Saralyn Pilar, DO [facility] [place], [place]

## 2022-12-06 NOTE — Assessment & Plan Note (Signed)
Clinically with secondary anxiety, adjustment disorde Hydroxyzine PRN

## 2022-12-06 NOTE — Assessment & Plan Note (Signed)
Well-controlled HTN No known complications  Off Amlodipine due to swelling   Plan:  1. Continue current BP regimen - HCTZ 25mg  daily 2. Encourage improved lifestyle - low sodium diet, regular exercise 3. Continue monitor BP outside office, bring readings to next visit, if persistently >140/90 or new symptoms notify office sooner

## 2022-12-06 NOTE — Progress Notes (Signed)
   Chief Complaint  Patient presents with   Ingrown Toenail    Patient came in today for right 5th toe ingrown, callus trim     Subjective: Patient presents today for evaluation of pain to the medial border right fifth toe. Patient is concerned for possible ingrown nail versus callus.  It is very sensitive to touch.  Patient presents today for further treatment and evaluation.  Past Medical History:  Diagnosis Date   Allergic rhinitis    HLD (hyperlipidemia)    HTN (hypertension)     Objective:  General: Well developed, nourished, in no acute distress, alert and oriented x3   Dermatology: Skin is warm, dry and supple bilateral.  Medial border right great toe is tender with evidence of an ingrowing nail. Pain on palpation noted to the border of the nail fold. The remaining nails appear unremarkable at this time. There are no open sores, lesions.  Vascular: DP and PT pulses palpable.  No clinical evidence of vascular compromise  Neruologic: Grossly intact via light touch bilateral.  Musculoskeletal: No pedal deformity noted  Assesement: #1 Paronychia with ingrowing nail medial border right great toe  Plan of Care:  1. Patient evaluated.  2. Discussed treatment alternatives and plan of care. Explained nail avulsion procedure and post procedure course to patient. 3. Patient opted for permanent partial nail avulsion of the ingrown portion of the nail.  4. Prior to procedure, local anesthesia infiltration utilized using 3 ml of a 50:50 mixture of 2% plain lidocaine and 0.5% plain marcaine in a normal hallux block fashion and a betadine prep performed.  5. Partial permanent nail avulsion with chemical matrixectomy performed using 3x30sec applications of phenol followed by alcohol flush.  6. Light dressing applied.  Post care instructions provided 7.  Return to clinic as needed  Felecia Shelling, DPM [facility]  Dr. Felecia Shelling, DPM    [address] [place], [place] [address]                Office [phone]  Fax [phone]

## 2022-12-06 NOTE — Assessment & Plan Note (Signed)
Controlled cholesterol on statin and lifestyle Last lipid panel 11/2022  Plan: 1. Continue current meds - Rosuvastatin 10mg  nightly 2. Encourage improved lifestyle - low carb/cholesterol, reduce portion size, continue improving regular exercise

## 2023-01-29 ENCOUNTER — Other Ambulatory Visit: Payer: Self-pay | Admitting: Family Medicine

## 2023-01-29 DIAGNOSIS — M545 Low back pain, unspecified: Secondary | ICD-10-CM

## 2023-01-29 NOTE — Telephone Encounter (Signed)
Requested medication (s) are due for refill today: yes  Requested medication (s) are on the active medication list: yes  Last refill:  10/23/22  Future visit scheduled: yes  Notes to clinic:  Unable to refill per protocol, cannot delegate.      Requested Prescriptions  Pending Prescriptions Disp Refills   traMADol (ULTRAM) 50 MG tablet [Pharmacy Med Name: TRAMADOL 50MG  TABLETS] 21 tablet     Sig: TAKE 1 TABLET(50 MG) BY MOUTH EVERY 8 HOURS AS NEEDED FOR MODERATE PAIN     Not Delegated - Analgesics:  Opioid Agonists Failed - 01/29/2023  1:09 PM      Failed - This refill cannot be delegated      Failed - Urine Drug Screen completed in last 360 days      Passed - Valid encounter within last 3 months    Recent Outpatient Visits           1 month ago Annual physical exam   [place] Smitty Cords, DO   9 months ago Essential hypertension   [place] [facility] Smitty Cords, DO   1 year ago Annual physical exam   [place] [facility] Mecum, Oswaldo [place], [place]   1 year ago Essential hypertension   [place] Smitty Cords, DO   1 year ago Essential hypertension   [place] [facility] Smitty Cords, DO       Future Appointments             In 10 months Althea Charon, Netta Neat, DO  [facility], [facility]

## 2023-04-29 ENCOUNTER — Other Ambulatory Visit: Payer: Self-pay | Admitting: Family Medicine

## 2023-04-29 DIAGNOSIS — J3089 Other allergic rhinitis: Secondary | ICD-10-CM

## 2023-04-29 DIAGNOSIS — E782 Mixed hyperlipidemia: Secondary | ICD-10-CM

## 2023-05-02 ENCOUNTER — Other Ambulatory Visit: Payer: Self-pay | Admitting: Family Medicine

## 2023-05-02 DIAGNOSIS — E782 Mixed hyperlipidemia: Secondary | ICD-10-CM

## 2023-05-05 ENCOUNTER — Other Ambulatory Visit: Payer: Self-pay | Admitting: Family Medicine

## 2023-05-05 DIAGNOSIS — J3089 Other allergic rhinitis: Secondary | ICD-10-CM

## 2023-05-05 DIAGNOSIS — M545 Low back pain, unspecified: Secondary | ICD-10-CM

## 2023-05-05 NOTE — Telephone Encounter (Signed)
Pharmacy change per request of pt Requested Prescriptions  Pending Prescriptions Disp Refills   rosuvastatin (CRESTOR) 10 MG tablet [Pharmacy Med Name: ROSUVASTATIN 10MG  TABLETS] 90 tablet 2    Sig: TAKE 1 TABLET(10 MG) BY MOUTH DAILY     Cardiovascular:  Antilipid - Statins 2 Failed - 05/02/2023  4:51 PM      Failed - Lipid Panel in normal range within the last 12 months    Cholesterol  Date Value Ref Range Status  12/02/2022 149 <200 mg/dL Final   LDL Cholesterol (Calc)  Date Value Ref Range Status  12/02/2022 77 mg/dL (calc) Final    Comment:    Reference range: <100 . Desirable range <100 mg/dL for primary prevention;   <70 mg/dL for patients with CHD or diabetic patients  with > or = 2 CHD risk factors. Marland Kitchen LDL-C is now calculated using the Martin-Hopkins  calculation, which is a validated novel method providing  better accuracy than the Friedewald equation in the  estimation of LDL-C.  Horald Pollen et al. Lenox Ahr. 7829;562(13): 2061-2068  (http://education.Quest[employer].com/faq/FAQ164)    HDL  Date Value Ref Range Status  12/02/2022 55 > OR = 40 mg/dL Final   Triglycerides  Date Value Ref Range Status  12/02/2022 86 <150 mg/dL Final         Passed - Cr in normal range and within 360 days    Creat  Date Value Ref Range Status  12/02/2022 0.98 0.70 - 1.30 mg/dL Final         Passed - Patient is not pregnant      Passed - Valid encounter within last 12 months    Recent Outpatient Visits           5 months ago Annual physical exam   [place] [facility] Sproul, Netta Neat, DO   1 year ago Essential hypertension   [facility] Smitty Cords, DO   1 year ago Annual physical exam   [facility] Mecum, Oswaldo [place], [place]   1 year ago Essential hypertension   [place] Smitty Cords, DO   2 years ago Essential hypertension   [place]  [facility] Smitty Cords, DO       Future Appointments             In 7 months Althea Charon, Netta Neat, DO [place] [facility], [facility]

## 2023-05-05 NOTE — Telephone Encounter (Signed)
Medication Refill - Medication: traMADol (ULTRAM) 50 MG tablet and montelukast (SINGULAIR) 10 MG tablet   Has the patient contacted their pharmacy? Yes.    Preferred Pharmacy (with phone number or street name):  [facility] [phone] - [place],  - [address] AT [address] Phone: [phone]  Fax: [phone]     Has the patient been seen for an appointment in the last year OR does the patient have an upcoming appointment? Yes.    Agent: Please be advised that RX refills may take up to 3 business days. We ask that you follow-up with your pharmacy.

## 2023-05-06 MED ORDER — MONTELUKAST SODIUM 10 MG PO TABS: 10.0000 mg | ORAL_TABLET | Freq: Every day | ORAL | 1 refills | Status: DC

## 2023-05-06 MED ORDER — TRAMADOL HCL 50 MG PO TABS
50.0000 mg | ORAL_TABLET | Freq: Three times a day (TID) | ORAL | 0 refills | Status: DC | PRN
Start: 2023-05-06 — End: 2023-08-19

## 2023-05-06 NOTE — Telephone Encounter (Signed)
Requested medication (s) are due for refill today: routing for review  Requested medication (s) are on the active medication list: yes  Last refill:  01/29/23  Future visit scheduled: yes  Notes to clinic:  Unable to refill per protocol, cannot delegate.      Requested Prescriptions  Pending Prescriptions Disp Refills   traMADol (ULTRAM) 50 MG tablet 21 tablet 0    Sig: Take 1 tablet (50 mg total) by mouth every 8 (eight) hours as needed for moderate pain.     Not Delegated - Analgesics:  Opioid Agonists Failed - 05/05/2023  3:47 PM      Failed - This refill cannot be delegated      Failed - Urine Drug Screen completed in last 360 days      Failed - Valid encounter within last 3 months    Recent Outpatient Visits           5 months ago Annual physical exam   [place] [facility] Smitty Cords, DO   1 year ago Essential hypertension   [place] [facility] Smitty Cords, DO   1 year ago Annual physical exam   [place] [facility] Mecum, Oswaldo [place], [place]   1 year ago Essential hypertension   [place] [facility] Smitty Cords, DO   2 years ago Essential hypertension   [place] [facility] Smitty Cords, DO       Future Appointments             In 7 months Althea Charon, Netta Neat, DO [facility]             montelukast (SINGULAIR) 10 MG tablet 90 tablet 3     Pulmonology:  Leukotriene Inhibitors Passed - 05/05/2023  3:47 PM      Passed - Valid encounter within last 12 months    Recent Outpatient Visits           5 months ago Annual physical exam   [place] [facility] Smitty Cords, DO   1 year ago Essential hypertension   [place] [facility] Smitty Cords, DO   1 year ago Annual physical exam   [place] [facility]  Mecum, Oswaldo [place], [place]   1 year ago Essential hypertension   [place] [facility] Smitty Cords, DO   2 years ago Essential hypertension   [place] [facility] Smitty Cords, DO       Future Appointments             In 7 months Althea Charon, Netta Neat, DO [place] [facility], [facility]

## 2023-05-12 ENCOUNTER — Other Ambulatory Visit: Payer: Self-pay | Admitting: Family Medicine

## 2023-05-12 DIAGNOSIS — R600 Localized edema: Secondary | ICD-10-CM

## 2023-05-12 DIAGNOSIS — I10 Essential (primary) hypertension: Secondary | ICD-10-CM

## 2023-08-19 ENCOUNTER — Other Ambulatory Visit: Payer: Self-pay | Admitting: Family Medicine

## 2023-08-19 DIAGNOSIS — F4322 Adjustment disorder with anxiety: Secondary | ICD-10-CM

## 2023-08-19 DIAGNOSIS — M545 Low back pain, unspecified: Secondary | ICD-10-CM

## 2023-08-19 DIAGNOSIS — J3089 Other allergic rhinitis: Secondary | ICD-10-CM

## 2023-08-19 DIAGNOSIS — F5104 Psychophysiologic insomnia: Secondary | ICD-10-CM

## 2023-08-19 DIAGNOSIS — R600 Localized edema: Secondary | ICD-10-CM

## 2023-08-19 DIAGNOSIS — I10 Essential (primary) hypertension: Secondary | ICD-10-CM

## 2023-08-19 DIAGNOSIS — E782 Mixed hyperlipidemia: Secondary | ICD-10-CM

## 2023-08-19 NOTE — Telephone Encounter (Signed)
 Medication Refill -  Most Recent Primary Care Visit:  Provider: EDMAN MARSA PARAS  Department: [facility]  Visit Type: PHYSICAL 20  Date: 12/06/2022  Medication:  traMADol  (ULTRAM ) 50 MG tablet  hydrochlorothiazide  (HYDRODIURIL ) 25 MG tablet [  montelukast  (SINGULAIR ) 10 MG tablet cyclobenzaprine  (FLEXERIL ) 10 MG tablet rosuvastatin  (CRESTOR ) 10 MG tablet hydrOXYzine  (ATARAX ) 50 MG tablet  Has the patient contacted their pharmacy? No   Is this the correct pharmacy for this prescription? Yes  This is the patient's preferred pharmacy:   [facility] [address] [place] [address] AT [facility] Phone: [phone]  Fax: [phone]       Has the prescription been filled recently? No  Is the patient out of the medication? Yes  Has the patient been seen for an appointment in the last year OR does the patient have an upcoming appointment? Yes  Can we respond through MyChart? No  Agent: Please be advised that Rx refills may take up to 3 business days. We ask that you follow-up with your pharmacy.

## 2023-08-21 MED ORDER — HYDROXYZINE HCL 50 MG PO TABS: ORAL_TABLET | ORAL | 0 refills | Status: DC

## 2023-08-21 MED ORDER — ROSUVASTATIN CALCIUM 10 MG PO TABS: 10.0000 mg | ORAL_TABLET | Freq: Every day | ORAL | 0 refills | Status: AC

## 2023-08-21 MED ORDER — HYDROCHLOROTHIAZIDE 25 MG PO TABS
25.0000 mg | ORAL_TABLET | Freq: Every day | ORAL | 0 refills | Status: AC
Start: 2023-08-21 — End: ?

## 2023-08-21 MED ORDER — MONTELUKAST SODIUM 10 MG PO TABS: 10.0000 mg | ORAL_TABLET | Freq: Every day | ORAL | 0 refills | Status: AC

## 2023-08-21 NOTE — Telephone Encounter (Signed)
 Requested Prescriptions  Pending Prescriptions Disp Refills   cyclobenzaprine  (FLEXERIL ) 10 MG tablet 90 tablet 3    Sig: Take 1 tablet (10 mg total) by mouth 2 (two) times daily as needed for muscle spasms.     Not Delegated - Analgesics:  Muscle Relaxants Failed - 08/21/2023  5:31 PM      Failed - This refill cannot be delegated      Failed - Valid encounter within last 6 months    Recent Outpatient Visits           8 months ago Annual physical exam   [place] [facility] Edman Marsa PARAS, DO   1 year ago Essential hypertension   [place] [facility] Edman Marsa PARAS, DO   1 year ago Annual physical exam   [place] [facility] Mecum, Rocky BRAVO, [place]   2 years ago Essential hypertension   [place] [facility] Edman Marsa PARAS, DO   2 years ago Essential hypertension   [place] [facility] Edman, Marsa PARAS, DO       Future Appointments             In 3 months Edman, Marsa PARAS, DO [place] [facility], [facility]             hydrochlorothiazide  (HYDRODIURIL ) 25 MG tablet 90 tablet 2     Cardiovascular: Diuretics - Thiazide Failed - 08/21/2023  5:31 PM      Failed - Cr in normal range and within 180 days    Creat  Date Value Ref Range Status  12/02/2022 0.98 0.70 - 1.30 mg/dL Final         Failed - K in normal range and within 180 days    Potassium  Date Value Ref Range Status  12/02/2022 4.1 3.5 - 5.3 mmol/L Final         Failed - Na in normal range and within 180 days    Sodium  Date Value Ref Range Status  12/02/2022 139 135 - 146 mmol/L Final         Failed - Valid encounter within last 6 months    Recent Outpatient Visits           8 months ago Annual physical exam   [place] [facility] Edman Marsa PARAS, DO   1 year ago Essential hypertension   [facility]  Edman Marsa PARAS, DO   1 year ago Annual physical exam   [place] [facility] Mecum, Rocky BRAVO, [place]   2 years ago Essential hypertension   [place] [facility] Edman Marsa PARAS, DO   2 years ago Essential hypertension   [place] [facility] Edman, Marsa PARAS, DO       Future Appointments             In 3 months Edman, Marsa PARAS, DO [place] [facility]            Passed - Last BP in normal range    BP Readings from Last 1 Encounters:  12/06/22 128/68          hydrOXYzine  (ATARAX ) 50 MG tablet 90 tablet 3    Sig: TAKE 1 TABLET(50 MG) BY MOUTH THREE TIMES DAILY AS NEEDED FOR ANXIETY     Ear, Nose, and Throat:  Antihistamines  2 Passed - 08/21/2023  5:31 PM      Passed - Cr in normal range and within 360 days    Creat  Date Value Ref Range Status  12/02/2022 0.98 0.70 - 1.30 mg/dL Final         Passed - Valid encounter within last 12 months    Recent Outpatient Visits           8 months ago Annual physical exam   [place] [facility] Edman Marsa PARAS, DO   1 year ago Essential hypertension   [place] [facility] Edman Marsa PARAS, DO   1 year ago Annual physical exam   [place] [facility] Mecum, Rocky BRAVO, [place]   2 years ago Essential hypertension   [place] [facility] Edman Marsa PARAS, DO   2 years ago Essential hypertension   [place] [facility] Edman Marsa PARAS, DO       Future Appointments             In 3 months Edman, Marsa PARAS, DO [facility]             montelukast  (SINGULAIR ) 10 MG tablet 90 tablet 1    Sig: Take 1 tablet (10 mg total) by mouth at bedtime.     Pulmonology:  Leukotriene Inhibitors Passed - 08/21/2023  5:31 PM      Passed - Valid encounter within last 12 months     Recent Outpatient Visits           8 months ago Annual physical exam   [place] [facility] Edman, Marsa PARAS, DO   1 year ago Essential hypertension   [place] [facility] Edman Marsa PARAS, DO   1 year ago Annual physical exam   [place] Mecum, Rocky BRAVO, [place]   2 years ago Essential hypertension   [place] [facility] Edman Marsa PARAS, DO   2 years ago Essential hypertension   [place] [facility] Edman Marsa PARAS, DO       Future Appointments             In 3 months Edman, Marsa PARAS, DO [place] [facility], [facility]             rosuvastatin  (CRESTOR ) 10 MG tablet 90 tablet 2     Cardiovascular:  Antilipid - Statins 2 Failed - 08/21/2023  5:31 PM      Failed - Lipid Panel in normal range within the last 12 months    Cholesterol  Date Value Ref Range Status  12/02/2022 149 <200 mg/dL Final   LDL Cholesterol (Calc)  Date Value Ref Range Status  12/02/2022 77 mg/dL (calc) Final    Comment:    Reference range: <100 . Desirable range <100 mg/dL for primary prevention;   <70 mg/dL for patients with CHD or diabetic patients  with > or = 2 CHD risk factors. SABRA LDL-C is now calculated using the Martin-Hopkins  calculation, which is a validated novel method providing  better accuracy than the Friedewald equation in the  estimation of LDL-C.  Gladis APPLETHWAITE et al. SANDREA. 7986;689(80): 2061-2068  (http://education.[facility].com/faq/FAQ164)    HDL  Date Value Ref Range Status  12/02/2022 55 > OR = 40 mg/dL Final   Triglycerides  Date Value Ref Range Status  12/02/2022 86 <150 mg/dL Final         Passed - Cr in normal range and within 360 days    Creat  Date Value Ref Range Status  12/02/2022 0.98 0.70 - 1.30 mg/dL Final         Passed - Patient is not pregnant      Passed - Valid encounter within  last 12 months    Recent Outpatient Visits           8 months ago Annual physical exam   [place] [facility] Edman Marsa PARAS, DO   1 year ago Essential hypertension   [place] [facility] Edman Marsa PARAS, DO   1 year ago Annual physical exam   [place] [facility] Mecum, Rocky BRAVO, [place]   2 years ago Essential hypertension   [place] [facility] Edman Marsa PARAS, DO   2 years ago Essential hypertension   [place] [facility] Edman Marsa PARAS, DO       Future Appointments             In 3 months Edman, Marsa PARAS, DO [place] [facility]             traMADol  (ULTRAM ) 50 MG tablet 21 tablet 0    Sig: Take 1 tablet (50 mg total) by mouth every 8 (eight) hours as needed for moderate pain (pain score 4-6).     Not Delegated - Analgesics:  Opioid Agonists Failed - 08/21/2023  5:31 PM      Failed - This refill cannot be delegated      Failed - Urine Drug Screen completed in last 360 days      Failed - Valid encounter within last 3 months    Recent Outpatient Visits           8 months ago Annual physical exam   [place] [facility] Edman Marsa PARAS, DO   1 year ago Essential hypertension   [place] Edman Marsa PARAS, DO   1 year ago Annual physical exam   [place] [facility] Mecum, Rocky BRAVO, PA-C   2 years ago Essential hypertension   [place] [facility] Edman Marsa PARAS, DO   2 years ago Essential hypertension   [place] [facility] Edman Marsa PARAS, DO       Future Appointments             In 3 months Edman, Marsa PARAS, DO  [facility], [facility]

## 2023-08-21 NOTE — Telephone Encounter (Signed)
 Requested medications are due for refill today.  yes  Requested medications are on the active medications list.  yes  Last refill. varied  Future visit scheduled.   yes  Notes to clinic.  Refill not delegated.    Requested Prescriptions  Pending Prescriptions Disp Refills   cyclobenzaprine  (FLEXERIL ) 10 MG tablet 90 tablet 3    Sig: Take 1 tablet (10 mg total) by mouth 2 (two) times daily as needed for muscle spasms.     Not Delegated - Analgesics:  Muscle Relaxants Failed - 08/21/2023  5:33 PM      Failed - This refill cannot be delegated      Failed - Valid encounter within last 6 months    Recent Outpatient Visits           8 months ago Annual physical exam   [place] [facility] Marsa PARAS, DO   1 year ago Essential hypertension   [place] [facility] Ed[facility] Marsa PARAS, DO   1 year ago Annual physical exam   [place] [facility] Mecum, [place] BRAVO, [place]   2 years ago Essential hypertension   [place] [facility] Edman Marsa PARAS, DO   2 years ago Essential hypertension   [place] [facility] Edman Marsa PARAS, DO       Future Appointments             In 3 months Edman, Marsa PARAS, DO [facility]             traMADol  (ULTRAM ) 50 MG tablet 21 tablet 0    Sig: Take 1 tablet (50 mg total) by mouth every 8 (eight) hours as needed for moderate pain (pain score 4-6).     Not Delegated - Analgesics:  Opioid Agonists Failed - 08/21/2023  5:33 PM      Failed - This refill cannot be delegated      Failed - Urine Drug Screen completed in last 360 days      Failed - Valid encounter within last 3 months    Recent Outpatient Visits           8 months ago Annual physical exam   [place] [facility] Edman Marsa PARAS, DO   1 year ago Essential hypertension   [place] [facility] Edman Marsa PARAS, DO   1 year ago Annual physical exam   [place] [facility] Mecum, Rocky BRAVO, [place]   2 years ago Essential hypertension   [place] [facility] Edman Marsa PARAS, DO   2 years ago Essential hypertension   [place] [facility] Edman Marsa PARAS, DO       Future Appointments             In 3 months Edman, Marsa PARAS, DO  [facility], [facility]            Signed Prescriptions Disp Refills   hydrochlorothiazide  (HYDRODIURIL ) 25 MG tablet 90 tablet 0    Sig: Take 1 tablet (25 mg total) by mouth daily.     Cardiovascular: Diuretics - Thiazide Failed - 08/21/2023  5:33 PM      Failed - Cr in normal range and within 180 days    Creat  Date Value Ref Range Status  12/02/2022 0.98 0.70 - 1.30 mg/dL Final  Failed - K in normal range and within 180 days    Potassium  Date Value Ref Range Status  12/02/2022 4.1 3.5 - 5.3 mmol/L Final         Failed - Na in normal range and within 180 days    Sodium  Date Value Ref Range Status  12/02/2022 139 135 - 146 mmol/L Final         Failed - Valid encounter within last 6 months    Recent Outpatient Visits           8 months ago Annual physical exam   [place] [facility] Edman Marsa PARAS, DO   1 year ago Essential hypertension   [place] [facility] Edman Marsa PARAS, DO   1 year ago Annual physical exam   [place] [facility] Mecum, [place]VO, [place]   2 years ago Essential hypertension   [place] [facility] Edman Marsa PARAS, DO   2 years ago Essential hypertension   [place] [facility] Edman, Marsa PARAS, DO       Future Appointments             In 3 months Edman, Marsa PARAS, DO [place] [facility]            Passed - Last  BP in normal range    BP Readings from Last 1 Encounters:  12/06/22 128/68          hydrOXYzine  (ATARAX ) 50 MG tablet 90 tablet 0    Sig: TAKE 1 TABLET(50 MG) BY MOUTH THREE TIMES DAILY AS NEEDED FOR ANXIETY     Ear, Nose, and Throat:  Antihistamines 2 Passed - 08/21/2023  5:33 PM      Passed - Cr in normal range and within 360 days    Creat  Date Value Ref Range Status  12/02/2022 0.98 0.70 - 1.30 mg/dL Final         Passed - Valid encounter within last 12 months    Recent Outpatient Visits           8 months ago Annual physical exam   [place] [facility] Ed[facility] Marsa PARAS, DO   1 year ago Essential hypertension   [place] [facility] Ed[facility] Marsa PARAS, DO   1 year ago Annual physical exam   [place] [facility] Mecum, Rocky BRAVO, PA-C   2 years ago Essential hypertension   [place] [facility] Edman Marsa PARAS, DO   2 years ago Essential hypertension   [place] [facility] [place], Marsa PARAS, DO       Future Appointments             In 3 months Edman, Marsa PARAS, DO  [facility]             montelukast  (SINGULAIR ) 10 MG tablet 90 tablet 0    Sig: Take 1 tablet (10 mg total) by mouth at bedtime.     Pulmonology:  Leukotriene Inhibitors Passed - 08/21/2023  5:33 PM      Passed - Valid encounter within last 12 months    Recent Outpatient Visits           8 months ago Annual physical exam    [facility], Marsa PARAS, DO   1 year  ago Essential hypertension   [facility] Edman, Marsa PARAS, DO   1 year ago Annual physical exam   [place] [facility] Mecum, [place]RAVO, [place]   2 years ago Essential hypertension   [place] [facility] Edman Marsa PARAS, DO   2 years ago Essential hypertension    [place] [facility] Edman Marsa PARAS, DO       Future Appointments             In 3 months Edman, Marsa PARAS, DO [facility], [facility]             rosuvastatin  (CRESTOR ) 10 MG tablet 90 tablet 0    Sig: Take 1 tablet (10 mg total) by mouth daily.     Cardiovascular:  Antilipid - Statins 2 Failed - 08/21/2023  5:33 PM      Failed - Lipid Panel in normal range within the last 12 months    Cholesterol  Date Value Ref Range Status  12/02/2022 149 <200 mg/dL Final   LDL Cholesterol (Calc)  Date Value Ref Range Status  12/02/2022 77 mg/dL (calc) Final    Comment:    Reference range: <100 . Desirable range <100 mg/dL for primary prevention;   <70 mg/dL for patients with CHD or diabetic patients  with > or = 2 CHD risk factors. SABRA LDL-C is now calculated using the Martin-Hopkins  calculation, which is a validated novel method providing  better accuracy than the Friedewald equation in the  estimation of LDL-C.  Gladis APPLETHWAITE et al. SANDREA. 7986;689(80): 2061-2068  (http://education.[employer].com/faq/FAQ164)    HDL  Date Value Ref Range Status  12/02/2022 55 > OR = 40 mg/dL Final   Triglycerides  Date Value Ref Range Status  12/02/2022 86 <150 mg/dL Final         Passed - Cr in normal range and within 360 days    Creat  Date Value Ref Range Status  12/02/2022 0.98 0.70 - 1.30 mg/dL Final         Passed - Patient is not pregnant      Passed - Valid encounter within last 12 months    Recent Outpatient Visits           8 months ago Annual physical exam   [place] [facility] [place], Marsa PARAS, DO   1 year ago Essential hypertension   [place] [facility] Edman Marsa PARAS, DO   1 year ago Annual physical exam   [place] [facility] Mecum, Rocky BRAVO, PA-C   2 years ago Essential hypertension   [place] [facility]  Edman Marsa PARAS, DO   2 years ago Essential hypertension   [place] [facility] Edman Marsa PARAS, DO       Future Appointments             In 3 months Edman, Marsa PARAS, DO [place] [facility], [facility]

## 2023-08-22 MED ORDER — CYCLOBENZAPRINE HCL 10 MG PO TABS
10.0000 mg | ORAL_TABLET | Freq: Two times a day (BID) | ORAL | 3 refills | Status: AC | PRN
Start: 2023-08-22 — End: ?

## 2023-08-22 MED ORDER — TRAMADOL HCL 50 MG PO TABS: 50.0000 mg | ORAL_TABLET | Freq: Three times a day (TID) | ORAL | 0 refills | Status: AC | PRN

## 2023-09-20 ENCOUNTER — Other Ambulatory Visit: Payer: Self-pay | Admitting: Family Medicine

## 2023-09-20 DIAGNOSIS — F4322 Adjustment disorder with anxiety: Secondary | ICD-10-CM

## 2023-09-20 DIAGNOSIS — F5104 Psychophysiologic insomnia: Secondary | ICD-10-CM

## 2023-09-22 NOTE — Telephone Encounter (Signed)
 Requested Prescriptions  Pending Prescriptions Disp Refills   hydrOXYzine  (ATARAX ) 50 MG tablet [Pharmacy Med Name: HYDROXYZINE  HCL 50 MG TABLET] 90 tablet 2    Sig: TAKE 1 TABLET(50 MG) BY MOUTH THREE TIMES DAILY AS NEEDED FOR ANXIETY     Ear, Nose, and Throat:  Antihistamines 2 Passed - 09/22/2023 12:03 PM      Passed - Cr in normal range and within 360 days    Creat  Date Value Ref Range Status  12/02/2022 0.98 0.70 - 1.30 mg/dL Final         Passed - Valid encounter within last 12 months    Recent Outpatient Visits           9 months ago Annual physical exam   [place] [facility] [place], Kayleen Party, DO   1 year ago Essential hypertension   [place] [facility] Raina Bunting, DO   1 year ago Annual physical exam   [place] [facility] Mecum, Pearla [place], PA-C   2 years ago Essential hypertension   [place] [facility] Raina Bunting, DO   2 years ago Essential hypertension   [place] [facility] Raina Bunting, DO       Future Appointments             In 2 months [place], Kayleen Party, DO [place] [facility], [facility]

## 2023-11-19 ENCOUNTER — Other Ambulatory Visit: Payer: Self-pay | Admitting: Family Medicine

## 2023-11-19 DIAGNOSIS — I10 Essential (primary) hypertension: Secondary | ICD-10-CM

## 2023-11-19 DIAGNOSIS — R600 Localized edema: Secondary | ICD-10-CM

## 2023-11-19 NOTE — Telephone Encounter (Signed)
 Requested medication (s) are due for refill today: yes  Requested medication (s) are on the active medication list: yes  Last refill:  08/21/23 #90 0 refills  Future visit scheduled: yes in 3 weeks  Notes to clinic:  last OV 12/06/22 protocol failed. Last labs 12/02/22. Do you want to refill Rx?     Requested Prescriptions  Pending Prescriptions Disp Refills   hydrochlorothiazide (HYDRODIURIL) 25 MG tablet [Pharmacy Med Name: HYDROCHLOROTHIAZIDE 25 MG TAB] 90 tablet 0    Sig: TAKE 1 TABLET (25 MG TOTAL) BY MOUTH DAILY.     Cardiovascular: Diuretics - Thiazide Failed - 11/19/2023  3:51 PM      Failed - Cr in normal range and within 180 days    Creat  Date Value Ref Range Status  12/02/2022 0.98 0.70 - 1.30 mg/dL Final         Failed - K in normal range and within 180 days    Potassium  Date Value Ref Range Status  12/02/2022 4.1 3.5 - 5.3 mmol/L Final         Failed - Na in normal range and within 180 days    Sodium  Date Value Ref Range Status  12/02/2022 139 135 - 146 mmol/L Final         Failed - Valid encounter within last 6 months    Recent Outpatient Visits   None     Future Appointments             In 3 weeks Timothy Charon Netta Neat, DO [place] [facility]            Passed - Last BP in normal range    BP Readings from Last 1 Encounters:  12/06/22 128/68

## 2023-12-05 ENCOUNTER — Other Ambulatory Visit: Payer: Self-pay

## 2023-12-05 DIAGNOSIS — E782 Mixed hyperlipidemia: Secondary | ICD-10-CM

## 2023-12-05 DIAGNOSIS — Z125 Encounter for screening for malignant neoplasm of prostate: Secondary | ICD-10-CM

## 2023-12-05 DIAGNOSIS — I10 Essential (primary) hypertension: Secondary | ICD-10-CM

## 2023-12-05 DIAGNOSIS — Z0000 Encounter for general adult medical examination without abnormal findings: Secondary | ICD-10-CM

## 2023-12-05 DIAGNOSIS — R7309 Other abnormal glucose: Secondary | ICD-10-CM

## 2023-12-08 ENCOUNTER — Other Ambulatory Visit: Payer: Self-pay

## 2023-12-12 ENCOUNTER — Encounter: Payer: Self-pay | Admitting: Family Medicine

## 2023-12-27 ENCOUNTER — Other Ambulatory Visit: Payer: Self-pay | Admitting: Family Medicine

## 2023-12-27 DIAGNOSIS — F5104 Psychophysiologic insomnia: Secondary | ICD-10-CM

## 2023-12-27 DIAGNOSIS — F4322 Adjustment disorder with anxiety: Secondary | ICD-10-CM

## 2023-12-30 NOTE — Telephone Encounter (Signed)
 Requested medication (s) are due for refill today: yes  Requested medication (s) are on the active medication list: yes  Last refill:  09/22/23 #90 2 RF  Future visit scheduled: no  Notes to clinic:  overdue labs   Requested Prescriptions  Pending Prescriptions Disp Refills   hydrOXYzine  (ATARAX ) 50 MG tablet [Pharmacy Med Name: HYDROXYZINE  HCL 50 MG TABLET] 90 tablet 2    Sig: TAKE 1 TABLET(50 MG) BY MOUTH THREE TIMES DAILY AS NEEDED FOR ANXIETY     Ear, Nose, and Throat:  Antihistamines 2 Failed - 12/30/2023 10:32 AM      Failed - Cr in normal range and within 360 days    Creat  Date Value Ref Range Status  12/02/2022 0.98 0.70 - 1.30 mg/dL Final         Failed - Valid encounter within last 12 months    Recent Outpatient Visits   None

## 2024-02-04 ENCOUNTER — Other Ambulatory Visit: Payer: Self-pay | Admitting: Family Medicine

## 2024-02-04 DIAGNOSIS — F4322 Adjustment disorder with anxiety: Secondary | ICD-10-CM

## 2024-02-04 DIAGNOSIS — F5104 Psychophysiologic insomnia: Secondary | ICD-10-CM

## 2024-02-05 NOTE — Telephone Encounter (Signed)
 Requested medications are due for refill today.  yes  Requested medications are on the active medications list.  yes  Last refill. 12/30/2023 #90 0 rf  Future visit scheduled.   no  Notes to clinic.  Labs are expired. Pt missed scheduled appt in May.    Requested Prescriptions  Pending Prescriptions Disp Refills   hydrOXYzine  (ATARAX ) 50 MG tablet [Pharmacy Med Name: HYDROXYZINE  HCL 50 MG TABLET] 90 tablet 0    Sig: TAKE 1 TABLET(50 MG) BY MOUTH THREE TIMES DAILY AS NEEDED FOR ANXIETY     Ear, Nose, and Throat:  Antihistamines 2 Failed - 02/05/2024  1:59 PM      Failed - Cr in normal range and within 360 days    Creat  Date Value Ref Range Status  12/02/2022 0.98 0.70 - 1.30 mg/dL Final         Failed - Valid encounter within last 12 months    Recent Outpatient Visits   None

## 2024-07-04 ENCOUNTER — Other Ambulatory Visit: Payer: Self-pay | Admitting: Family Medicine

## 2024-07-04 DIAGNOSIS — F5104 Psychophysiologic insomnia: Secondary | ICD-10-CM

## 2024-07-04 DIAGNOSIS — F4322 Adjustment disorder with anxiety: Secondary | ICD-10-CM

## 2024-07-05 NOTE — Telephone Encounter (Signed)
 Requested medications are due for refill today.  yes  Requested medications are on the active medications list.  yes  Last refill. 12/30/2023 #90 0 rf  Future visit scheduled.   no  Notes to clinic.  Pt appears to be now going to a different clinic.    Requested Prescriptions  Pending Prescriptions Disp Refills   hydrOXYzine  (ATARAX ) 50 MG tablet [Pharmacy Med Name: HYDROXYZINE  HCL 50 MG TABLET] 90 tablet 0    Sig: TAKE 1 TABLET(50 MG) BY MOUTH THREE TIMES DAILY AS NEEDED FOR ANXIETY     Ear, Nose, and Throat:  Antihistamines 2 Failed - 07/05/2024  5:51 PM      Failed - Cr in normal range and within 360 days    Creat  Date Value Ref Range Status  12/02/2022 0.98 0.70 - 1.30 mg/dL Final         Failed - Valid encounter within last 12 months    Recent Outpatient Visits   None

## 2024-08-14 ENCOUNTER — Other Ambulatory Visit: Payer: Self-pay | Admitting: Family Medicine

## 2024-08-14 DIAGNOSIS — M545 Low back pain, unspecified: Secondary | ICD-10-CM

## 2024-08-16 NOTE — Telephone Encounter (Signed)
 Requested medication (s) are due for refill today: yes  Requested medication (s) are on the active medication list: yes  Last refill:  08/22/23  Future visit scheduled: no  Notes to clinic:  Unable to refill per protocol, cannot delegate.      Requested Prescriptions  Pending Prescriptions Disp Refills   cyclobenzaprine  (FLEXERIL ) 10 MG tablet [Pharmacy Med Name: CYCLOBENZAPRINE  10 MG TABLET] 90 tablet 3    Sig: TAKE 1 TABLET BY MOUTH TWICE A DAY AS NEEDED FOR MUSCLE SPASMS     Not Delegated - Analgesics:  Muscle Relaxants Failed - 08/16/2024  2:30 PM      Failed - This refill cannot be delegated      Failed - Valid encounter within last 6 months    Recent Outpatient Visits   None
